# Patient Record
Sex: Female | Born: 1975 | Race: White | Hispanic: No | Marital: Married | Smoking: Never smoker
Health system: Southern US, Community
[De-identification: ages and names within clinical notes are randomized; demographics above are authoritative.]

## PROBLEM LIST (undated history)

## (undated) DIAGNOSIS — J45909 Unspecified asthma, uncomplicated: Secondary | ICD-10-CM

## (undated) DIAGNOSIS — T4145XA Adverse effect of unspecified anesthetic, initial encounter: Secondary | ICD-10-CM

## (undated) DIAGNOSIS — T8859XA Other complications of anesthesia, initial encounter: Secondary | ICD-10-CM

## (undated) HISTORY — PX: DIAGNOSTIC LAPAROSCOPY: SUR761

---

## 2014-11-18 ENCOUNTER — Inpatient Hospital Stay (HOSPITAL_COMMUNITY): Admit: 2014-11-18 | Payer: Self-pay | Admitting: Obstetrics and Gynecology

## 2015-04-22 ENCOUNTER — Other Ambulatory Visit: Payer: Self-pay | Admitting: Obstetrics and Gynecology

## 2015-04-22 ENCOUNTER — Encounter (HOSPITAL_COMMUNITY): Payer: Self-pay | Admitting: *Deleted

## 2015-04-23 ENCOUNTER — Inpatient Hospital Stay (HOSPITAL_COMMUNITY): Payer: Commercial Managed Care - PPO | Admitting: Anesthesiology

## 2015-04-23 ENCOUNTER — Encounter (HOSPITAL_COMMUNITY): Payer: Self-pay | Admitting: *Deleted

## 2015-04-23 ENCOUNTER — Encounter (HOSPITAL_COMMUNITY)
Admission: AD | Disposition: A | Discharge status: Home / Self Care | Payer: Self-pay | Source: Ambulatory Visit | Attending: Obstetrics and Gynecology

## 2015-04-23 ENCOUNTER — Inpatient Hospital Stay (HOSPITAL_COMMUNITY)
Admission: AD | Admit: 2015-04-23 | Discharge: 2015-04-26 | DRG: 765 | Disposition: A | Discharge status: Home / Self Care | Payer: Commercial Managed Care - PPO | Source: Ambulatory Visit | Attending: Obstetrics and Gynecology | Admitting: Obstetrics and Gynecology

## 2015-04-23 DIAGNOSIS — Z3A37 37 weeks gestation of pregnancy: Secondary | ICD-10-CM

## 2015-04-23 DIAGNOSIS — O34211 Maternal care for low transverse scar from previous cesarean delivery: Secondary | ICD-10-CM | POA: Diagnosis present

## 2015-04-23 DIAGNOSIS — O9962 Diseases of the digestive system complicating childbirth: Secondary | ICD-10-CM | POA: Diagnosis present

## 2015-04-23 DIAGNOSIS — O99214 Obesity complicating childbirth: Secondary | ICD-10-CM | POA: Diagnosis present

## 2015-04-23 DIAGNOSIS — K219 Gastro-esophageal reflux disease without esophagitis: Secondary | ICD-10-CM | POA: Diagnosis present

## 2015-04-23 DIAGNOSIS — O328XX0 Maternal care for other malpresentation of fetus, not applicable or unspecified: Secondary | ICD-10-CM | POA: Diagnosis present

## 2015-04-23 DIAGNOSIS — J45909 Unspecified asthma, uncomplicated: Secondary | ICD-10-CM | POA: Diagnosis present

## 2015-04-23 DIAGNOSIS — E669 Obesity, unspecified: Secondary | ICD-10-CM | POA: Diagnosis present

## 2015-04-23 DIAGNOSIS — K831 Obstruction of bile duct: Secondary | ICD-10-CM | POA: Diagnosis present

## 2015-04-23 DIAGNOSIS — Z302 Encounter for sterilization: Secondary | ICD-10-CM | POA: Diagnosis not present

## 2015-04-23 DIAGNOSIS — O9952 Diseases of the respiratory system complicating childbirth: Secondary | ICD-10-CM | POA: Diagnosis present

## 2015-04-23 DIAGNOSIS — O9902 Anemia complicating childbirth: Secondary | ICD-10-CM | POA: Diagnosis present

## 2015-04-23 DIAGNOSIS — Z6836 Body mass index (BMI) 36.0-36.9, adult: Secondary | ICD-10-CM

## 2015-04-23 DIAGNOSIS — O2662 Liver and biliary tract disorders in childbirth: Principal | ICD-10-CM | POA: Diagnosis present

## 2015-04-23 HISTORY — DX: Adverse effect of unspecified anesthetic, initial encounter: T41.45XA

## 2015-04-23 HISTORY — DX: Unspecified asthma, uncomplicated: J45.909

## 2015-04-23 HISTORY — DX: Other complications of anesthesia, initial encounter: T88.59XA

## 2015-04-23 LAB — CBC
HCT: 32.9 % — ABNORMAL LOW (ref 36.0–46.0)
Hemoglobin: 11.3 g/dL — ABNORMAL LOW (ref 12.0–15.0)
MCH: 31.1 pg (ref 26.0–34.0)
MCHC: 34.3 g/dL (ref 30.0–36.0)
MCV: 90.6 fL (ref 78.0–100.0)
PLATELETS: 190 10*3/uL (ref 150–400)
RBC: 3.63 MIL/uL — AB (ref 3.87–5.11)
RDW: 13 % (ref 11.5–15.5)
WBC: 6.8 10*3/uL (ref 4.0–10.5)

## 2015-04-23 LAB — TYPE AND SCREEN
ABO/RH(D): B POS
Antibody Screen: NEGATIVE

## 2015-04-23 LAB — ABO/RH: ABO/RH(D): B POS

## 2015-04-23 SURGERY — Surgical Case
Anesthesia: Spinal | Laterality: Bilateral

## 2015-04-23 MED ORDER — OXYTOCIN 40 UNITS IN LACTATED RINGERS INFUSION - SIMPLE MED: 62.5000 mL/h | INTRAVENOUS | Status: AC

## 2015-04-23 MED ORDER — ONDANSETRON HCL 4 MG/2ML IJ SOLN
INTRAMUSCULAR | Status: DC | PRN
  Administered 2015-04-23: 4 mg via INTRAVENOUS

## 2015-04-23 MED ORDER — MEPERIDINE HCL 25 MG/ML IJ SOLN: 6.2500 mg | INTRAMUSCULAR | Status: DC | PRN

## 2015-04-23 MED ORDER — NALBUPHINE HCL 10 MG/ML IJ SOLN
5.0000 mg | Freq: Once | INTRAMUSCULAR | Status: AC | PRN
  Administered 2015-04-23: 5 mg via SUBCUTANEOUS

## 2015-04-23 MED ORDER — ACETAMINOPHEN 325 MG PO TABS
650.0000 mg | ORAL_TABLET | ORAL | Status: DC | PRN
  Administered 2015-04-23: 650 mg via ORAL
  Filled 2015-04-23: qty 2

## 2015-04-23 MED ORDER — FENTANYL CITRATE (PF) 100 MCG/2ML IJ SOLN
INTRAMUSCULAR | Status: DC | PRN
Start: 2015-04-23 — End: 2015-04-23
  Administered 2015-04-23: 20 ug via INTRATHECAL

## 2015-04-23 MED ORDER — SCOPOLAMINE 1 MG/3DAYS TD PT72
1.0000 | MEDICATED_PATCH | Freq: Once | TRANSDERMAL | Status: DC
  Administered 2015-04-23: 1.5 mg via TRANSDERMAL

## 2015-04-23 MED ORDER — PHENYLEPHRINE 8 MG IN D5W 100 ML (0.08MG/ML) PREMIX OPTIME
INJECTION | INTRAVENOUS | Status: DC | PRN
  Administered 2015-04-23: 60 ug/min via INTRAVENOUS

## 2015-04-23 MED ORDER — CEFAZOLIN SODIUM-DEXTROSE 2-3 GM-% IV SOLR
2.0000 g | INTRAVENOUS | Status: AC
  Administered 2015-04-23: 2 g via INTRAVENOUS

## 2015-04-23 MED ORDER — MENTHOL 3 MG MT LOZG: 1.0000 | LOZENGE | OROMUCOSAL | Status: DC | PRN

## 2015-04-23 MED ORDER — SODIUM CHLORIDE 0.9 % IJ SOLN: 3.0000 mL | Freq: Two times a day (BID) | INTRAMUSCULAR | Status: DC

## 2015-04-23 MED ORDER — IBUPROFEN 600 MG PO TABS: 600.0000 mg | ORAL_TABLET | Freq: Four times a day (QID) | ORAL | Status: DC | PRN

## 2015-04-23 MED ORDER — FENTANYL CITRATE (PF) 100 MCG/2ML IJ SOLN: 25.0000 ug | INTRAMUSCULAR | Status: DC | PRN

## 2015-04-23 MED ORDER — METHYLERGONOVINE MALEATE 0.2 MG/ML IJ SOLN: 0.2000 mg | INTRAMUSCULAR | Status: DC | PRN

## 2015-04-23 MED ORDER — BUPIVACAINE IN DEXTROSE 0.75-8.25 % IT SOLN
INTRATHECAL | Status: DC | PRN
  Administered 2015-04-23: 1.6 mL via INTRATHECAL

## 2015-04-23 MED ORDER — 0.9 % SODIUM CHLORIDE (POUR BTL) OPTIME
TOPICAL | Status: DC | PRN
  Administered 2015-04-23: 1000 mL

## 2015-04-23 MED ORDER — LACTATED RINGERS IV SOLN
INTRAVENOUS | Status: DC
  Administered 2015-04-23: 21:00:00 via INTRAVENOUS

## 2015-04-23 MED ORDER — MORPHINE SULFATE (PF) 0.5 MG/ML IJ SOLN
INTRAMUSCULAR | Status: AC
  Filled 2015-04-23: qty 100

## 2015-04-23 MED ORDER — SENNOSIDES-DOCUSATE SODIUM 8.6-50 MG PO TABS
2.0000 | ORAL_TABLET | ORAL | Status: DC
  Administered 2015-04-23: 2 via ORAL
  Filled 2015-04-23 (×2): qty 2

## 2015-04-23 MED ORDER — SODIUM CHLORIDE 0.9 % IV SOLN: 250.0000 mL | INTRAVENOUS | Status: DC

## 2015-04-23 MED ORDER — LACTATED RINGERS IV SOLN
INTRAVENOUS | Status: DC | PRN
  Administered 2015-04-23: 12:00:00 via INTRAVENOUS

## 2015-04-23 MED ORDER — SCOPOLAMINE 1 MG/3DAYS TD PT72
MEDICATED_PATCH | TRANSDERMAL | Status: AC
  Administered 2015-04-23: 1.5 mg via TRANSDERMAL
  Filled 2015-04-23: qty 1

## 2015-04-23 MED ORDER — SIMETHICONE 80 MG PO CHEW
80.0000 mg | CHEWABLE_TABLET | ORAL | Status: DC
  Administered 2015-04-24 – 2015-04-25 (×2): 80 mg via ORAL
  Filled 2015-04-23 (×3): qty 1

## 2015-04-23 MED ORDER — NALBUPHINE HCL 10 MG/ML IJ SOLN: 5.0000 mg | INTRAMUSCULAR | Status: DC | PRN

## 2015-04-23 MED ORDER — PHENYLEPHRINE HCL 10 MG/ML IJ SOLN
INTRAMUSCULAR | Status: DC | PRN
  Administered 2015-04-23: 80 ug via INTRAVENOUS

## 2015-04-23 MED ORDER — BUPIVACAINE HCL (PF) 0.25 % IJ SOLN
INTRAMUSCULAR | Status: AC
  Filled 2015-04-23: qty 30

## 2015-04-23 MED ORDER — MORPHINE SULFATE (PF) 0.5 MG/ML IJ SOLN
INTRAMUSCULAR | Status: DC | PRN
  Administered 2015-04-23: .1 mg via INTRATHECAL

## 2015-04-23 MED ORDER — WITCH HAZEL-GLYCERIN EX PADS: 1.0000 "application " | MEDICATED_PAD | CUTANEOUS | Status: DC | PRN

## 2015-04-23 MED ORDER — DIPHENHYDRAMINE HCL 25 MG PO CAPS: 25.0000 mg | ORAL_CAPSULE | Freq: Four times a day (QID) | ORAL | Status: DC | PRN

## 2015-04-23 MED ORDER — IBUPROFEN 600 MG PO TABS
600.0000 mg | ORAL_TABLET | Freq: Four times a day (QID) | ORAL | Status: DC
  Administered 2015-04-23 – 2015-04-26 (×11): 600 mg via ORAL
  Filled 2015-04-23 (×12): qty 1

## 2015-04-23 MED ORDER — OXYTOCIN 10 UNIT/ML IJ SOLN
40.0000 [IU] | INTRAVENOUS | Status: DC | PRN
  Administered 2015-04-23: 40 [IU] via INTRAVENOUS

## 2015-04-23 MED ORDER — LACTATED RINGERS IV SOLN
INTRAVENOUS | Status: DC
  Administered 2015-04-23 (×3): via INTRAVENOUS

## 2015-04-23 MED ORDER — LANOLIN HYDROUS EX OINT: 1.0000 "application " | TOPICAL_OINTMENT | CUTANEOUS | Status: DC | PRN

## 2015-04-23 MED ORDER — NALOXONE HCL 2 MG/2ML IJ SOSY
1.0000 ug/kg/h | PREFILLED_SYRINGE | INTRAVENOUS | Status: DC | PRN
  Filled 2015-04-23: qty 2

## 2015-04-23 MED ORDER — OXYCODONE-ACETAMINOPHEN 5-325 MG PO TABS
1.0000 | ORAL_TABLET | ORAL | Status: DC | PRN
  Administered 2015-04-24 (×2): 1 via ORAL
  Filled 2015-04-23 (×5): qty 1

## 2015-04-23 MED ORDER — DIPHENHYDRAMINE HCL 50 MG/ML IJ SOLN: 12.5000 mg | INTRAMUSCULAR | Status: DC | PRN

## 2015-04-23 MED ORDER — SODIUM CHLORIDE 0.9 % IJ SOLN: 3.0000 mL | INTRAMUSCULAR | Status: DC | PRN

## 2015-04-23 MED ORDER — KETOROLAC TROMETHAMINE 30 MG/ML IJ SOLN: 30.0000 mg | Freq: Four times a day (QID) | INTRAMUSCULAR | Status: AC | PRN

## 2015-04-23 MED ORDER — NALBUPHINE HCL 10 MG/ML IJ SOLN
5.0000 mg | INTRAMUSCULAR | Status: DC | PRN
  Administered 2015-04-23: 5 mg via SUBCUTANEOUS

## 2015-04-23 MED ORDER — ONDANSETRON HCL 4 MG/2ML IJ SOLN
INTRAMUSCULAR | Status: AC
  Filled 2015-04-23: qty 2

## 2015-04-23 MED ORDER — BUPIVACAINE HCL (PF) 0.25 % IJ SOLN
INTRAMUSCULAR | Status: DC | PRN
  Administered 2015-04-23: 10 mL

## 2015-04-23 MED ORDER — NALOXONE HCL 0.4 MG/ML IJ SOLN: 0.4000 mg | INTRAMUSCULAR | Status: DC | PRN

## 2015-04-23 MED ORDER — KETOROLAC TROMETHAMINE 30 MG/ML IJ SOLN
INTRAMUSCULAR | Status: AC
  Filled 2015-04-23: qty 1

## 2015-04-23 MED ORDER — FENTANYL CITRATE (PF) 100 MCG/2ML IJ SOLN
INTRAMUSCULAR | Status: AC
  Filled 2015-04-23: qty 4

## 2015-04-23 MED ORDER — BUPIVACAINE IN DEXTROSE 0.75-8.25 % IT SOLN
INTRATHECAL | Status: AC
  Filled 2015-04-23: qty 2

## 2015-04-23 MED ORDER — SIMETHICONE 80 MG PO CHEW: 80.0000 mg | CHEWABLE_TABLET | ORAL | Status: DC | PRN

## 2015-04-23 MED ORDER — KETOROLAC TROMETHAMINE 30 MG/ML IJ SOLN
30.0000 mg | Freq: Four times a day (QID) | INTRAMUSCULAR | Status: AC | PRN
  Administered 2015-04-23: 30 mg via INTRAMUSCULAR

## 2015-04-23 MED ORDER — PRENATAL MULTIVITAMIN CH
1.0000 | ORAL_TABLET | Freq: Every day | ORAL | Status: DC
  Administered 2015-04-24 – 2015-04-26 (×3): 1 via ORAL
  Filled 2015-04-23 (×3): qty 1

## 2015-04-23 MED ORDER — BISACODYL 10 MG RE SUPP: 10.0000 mg | Freq: Every day | RECTAL | Status: DC | PRN

## 2015-04-23 MED ORDER — CEFAZOLIN SODIUM-DEXTROSE 2-3 GM-% IV SOLR
INTRAVENOUS | Status: AC
  Filled 2015-04-23: qty 50

## 2015-04-23 MED ORDER — METHYLERGONOVINE MALEATE 0.2 MG PO TABS: 0.2000 mg | ORAL_TABLET | ORAL | Status: DC | PRN

## 2015-04-23 MED ORDER — OXYCODONE-ACETAMINOPHEN 5-325 MG PO TABS
2.0000 | ORAL_TABLET | ORAL | Status: DC | PRN
  Administered 2015-04-24 – 2015-04-26 (×9): 2 via ORAL
  Filled 2015-04-23 (×9): qty 2

## 2015-04-23 MED ORDER — NALBUPHINE HCL 10 MG/ML IJ SOLN
INTRAMUSCULAR | Status: AC
  Filled 2015-04-23: qty 1

## 2015-04-23 MED ORDER — MEASLES, MUMPS & RUBELLA VAC ~~LOC~~ INJ: 0.5000 mL | INJECTION | Freq: Once | SUBCUTANEOUS | Status: DC

## 2015-04-23 MED ORDER — SIMETHICONE 80 MG PO CHEW
80.0000 mg | CHEWABLE_TABLET | Freq: Three times a day (TID) | ORAL | Status: DC
  Administered 2015-04-23 – 2015-04-26 (×8): 80 mg via ORAL
  Filled 2015-04-23 (×8): qty 1

## 2015-04-23 MED ORDER — OXYTOCIN 10 UNIT/ML IJ SOLN
INTRAMUSCULAR | Status: AC
  Filled 2015-04-23: qty 4

## 2015-04-23 MED ORDER — DEXAMETHASONE SODIUM PHOSPHATE 4 MG/ML IJ SOLN
INTRAMUSCULAR | Status: AC
  Filled 2015-04-23: qty 2

## 2015-04-23 MED ORDER — DEXAMETHASONE SODIUM PHOSPHATE 4 MG/ML IJ SOLN: INTRAMUSCULAR | Status: DC | PRN

## 2015-04-23 MED ORDER — ZOLPIDEM TARTRATE 5 MG PO TABS: 5.0000 mg | ORAL_TABLET | Freq: Every evening | ORAL | Status: DC | PRN

## 2015-04-23 MED ORDER — FLEET ENEMA 7-19 GM/118ML RE ENEM: 1.0000 | ENEMA | Freq: Every day | RECTAL | Status: DC | PRN

## 2015-04-23 MED ORDER — ONDANSETRON HCL 4 MG/2ML IJ SOLN: 4.0000 mg | Freq: Three times a day (TID) | INTRAMUSCULAR | Status: DC | PRN

## 2015-04-23 MED ORDER — PHENYLEPHRINE 8 MG IN D5W 100 ML (0.08MG/ML) PREMIX OPTIME
INJECTION | INTRAVENOUS | Status: AC
  Filled 2015-04-23: qty 100

## 2015-04-23 MED ORDER — DIBUCAINE 1 % RE OINT
1.0000 | TOPICAL_OINTMENT | RECTAL | Status: DC | PRN
Start: 2015-04-23 — End: 2015-04-26

## 2015-04-23 MED ORDER — DIPHENHYDRAMINE HCL 25 MG PO CAPS: 25.0000 mg | ORAL_CAPSULE | ORAL | Status: DC | PRN

## 2015-04-23 MED ORDER — NALBUPHINE HCL 10 MG/ML IJ SOLN: 5.0000 mg | Freq: Once | INTRAMUSCULAR | Status: AC | PRN

## 2015-04-23 MED ORDER — FERROUS SULFATE 325 (65 FE) MG PO TABS
325.0000 mg | ORAL_TABLET | Freq: Two times a day (BID) | ORAL | Status: DC
  Administered 2015-04-23 – 2015-04-26 (×6): 325 mg via ORAL
  Filled 2015-04-23 (×6): qty 1

## 2015-04-23 SURGICAL SUPPLY — 41 items
BARRIER ADHS 3X4 INTERCEED (GAUZE/BANDAGES/DRESSINGS) ×2 IMPLANT
BENZOIN TINCTURE PRP APPL 2/3 (GAUZE/BANDAGES/DRESSINGS) IMPLANT
CLAMP CORD UMBIL (MISCELLANEOUS) IMPLANT
CLOTH BEACON ORANGE TIMEOUT ST (SAFETY) ×2 IMPLANT
CONTAINER PREFILL 10% NBF 15ML (MISCELLANEOUS) IMPLANT
DRAPE C SECTION CLR SCREEN (DRAPES) ×2 IMPLANT
DRAPE SHEET LG 3/4 BI-LAMINATE (DRAPES) IMPLANT
DRSG OPSITE POSTOP 4X10 (GAUZE/BANDAGES/DRESSINGS) ×2 IMPLANT
DURAPREP 26ML APPLICATOR (WOUND CARE) ×2 IMPLANT
ELECT REM PT RETURN 9FT ADLT (ELECTROSURGICAL) ×2
ELECTRODE REM PT RTRN 9FT ADLT (ELECTROSURGICAL) ×1 IMPLANT
EXTRACTOR VACUUM M CUP 4 TUBE (SUCTIONS) IMPLANT
GLOVE BIOGEL PI IND STRL 7.0 (GLOVE) ×1 IMPLANT
GLOVE BIOGEL PI INDICATOR 7.0 (GLOVE) ×1
GLOVE ECLIPSE 6.5 STRL STRAW (GLOVE) ×2 IMPLANT
GOWN STRL REUS W/TWL LRG LVL3 (GOWN DISPOSABLE) ×4 IMPLANT
KIT ABG SYR 3ML LUER SLIP (SYRINGE) IMPLANT
NEEDLE HYPO 22GX1.5 SAFETY (NEEDLE) ×2 IMPLANT
NEEDLE HYPO 25X5/8 SAFETYGLIDE (NEEDLE) IMPLANT
NS IRRIG 1000ML POUR BTL (IV SOLUTION) ×2 IMPLANT
PACK C SECTION WH (CUSTOM PROCEDURE TRAY) ×2 IMPLANT
PAD OB MATERNITY 4.3X12.25 (PERSONAL CARE ITEMS) ×2 IMPLANT
RETRACTOR WND ALEXIS 25 LRG (MISCELLANEOUS) ×1 IMPLANT
RTRCTR C-SECT PINK 25CM LRG (MISCELLANEOUS) IMPLANT
RTRCTR WOUND ALEXIS 25CM LRG (MISCELLANEOUS) ×2
STAPLER VISISTAT 35W (STAPLE) ×2 IMPLANT
STRIP CLOSURE SKIN 1/2X4 (GAUZE/BANDAGES/DRESSINGS) IMPLANT
SUT CHROMIC GUT AB #0 18 (SUTURE) IMPLANT
SUT MNCRL 0 VIOLET CTX 36 (SUTURE) ×3 IMPLANT
SUT MON AB 4-0 PS1 27 (SUTURE) IMPLANT
SUT MONOCRYL 0 CTX 36 (SUTURE) ×3
SUT PLAIN 2 0 (SUTURE)
SUT PLAIN 2 0 XLH (SUTURE) ×2 IMPLANT
SUT PLAIN ABS 2-0 CT1 27XMFL (SUTURE) IMPLANT
SUT VIC AB 0 CT1 36 (SUTURE) ×4 IMPLANT
SUT VIC AB 2-0 CT1 27 (SUTURE) ×1
SUT VIC AB 2-0 CT1 TAPERPNT 27 (SUTURE) ×1 IMPLANT
SUT VIC AB 4-0 PS2 27 (SUTURE) IMPLANT
SYR CONTROL 10ML LL (SYRINGE) ×2 IMPLANT
TOWEL OR 17X24 6PK STRL BLUE (TOWEL DISPOSABLE) ×2 IMPLANT
TRAY FOLEY CATH SILVER 14FR (SET/KITS/TRAYS/PACK) IMPLANT

## 2015-04-23 NOTE — H&P (Signed)
Mindy Stewart is a 39 y.o. female presenting for rpt C/S, BTL @ 37 weeks due to ICP. Pt desires permanent sterilization. Prev C/S x 2  Maternal Medical History:  Fetal activity: Perceived fetal activity is normal.    Prenatal complications: ICP    OB History    No data available     Past Medical History  Diagnosis Date  . Complication of anesthesia     problems with epidural- 3 attempts  . Asthma     rare inhaler use   Past Surgical History  Procedure Laterality Date  . Cesarean section      x 2  . Diagnostic laparoscopy      2002   Family History: family history is not on file. Social History:  reports that she has never smoked. She does not have any smokeless tobacco history on file. Her alcohol and drug histories are not on file.   Prenatal Transfer Tool  Maternal Diabetes: No Genetic Screening: Normal Maternal Ultrasounds/Referrals: Normal Fetal Ultrasounds or other Referrals:  None Maternal Substance Abuse:  No Significant Maternal Medications:  Meds include: Other: actigall Significant Maternal Lab Results:  Lab values include: Group B Strep negative Other Comments:  ICP  Review of Systems  All other systems reviewed and are negative.     There were no vitals taken for this visit. Maternal Exam:  Abdomen: Patient reports no abdominal tenderness.   Physical Exam  Constitutional: She is oriented to person, place, and time. She appears well-developed and well-nourished.  HENT:  Head: Atraumatic.  Eyes: EOM are normal.  Neck: Neck supple.  Cardiovascular: Regular rhythm.   Respiratory: Effort normal.  Neurological: She is alert and oriented to person, place, and time.  Skin: Skin is warm and dry.  Psychiatric: She has a normal mood and affect.    Prenatal labs: ABO, Rh:  B positive Antibody:  neg Rubella:  indeterminate RPR:   NR HBsAg:   neg HIV:   NR GBS:   negative( 10/21)  Assessment/Plan: ICP IUP @ 37 weeks Previous C/S x  2 Desires sterilization P) admit rpt C/s, TL. Routine labs. Rubella pp vaccine   Jammal Sarr A 04/23/2015, 7:39 AM

## 2015-04-23 NOTE — Anesthesia Postprocedure Evaluation (Signed)
  Anesthesia Post-op Note  Patient: Mindy Stewart  Procedure(s) Performed: Procedure(s) with comments: CESAREAN SECTION WITH BILATERAL TUBAL LIGATION (Bilateral) - EDDL: 05/13/15 Allergy: Coconut, Tree Nuts, Prednisone  Patient Location: Mother/Baby  Anesthesia Type:Spinal and Epidural  Level of Consciousness: awake, alert , oriented and patient cooperative  Airway and Oxygen Therapy: Patient Spontanous Breathing  Post-op Pain: none  Post-op Assessment: Post-op Vital signs reviewed, Patient's Cardiovascular Status Stable, Respiratory Function Stable, Patent Airway, No headache, No backache and Patient able to bend at knees              Post-op Vital Signs: Reviewed and stable  Last Vitals:  Filed Vitals:   04/23/15 1710  BP: 97/74  Pulse: 73  Temp:   Resp:     Complications: No apparent anesthesia complications

## 2015-04-23 NOTE — Brief Op Note (Signed)
04/23/2015  12:09 PM  PATIENT:  Mindy Stewart  39 y.o. female  PRE-OPERATIVE DIAGNOSIS:  Previous Cesarean Section x 2, Cholestasis of Pregnancy , desires sterilization, IUP @ 37 1/7 weeks  POST-OPERATIVE DIAGNOSIS:  Previous Cesarean Section x 2, Cholestasis of Pregnancy, desires sterilization, IUP @ 37 1/7 weeks  PROCEDURE: Repeat Cesarean section, kerr hysterotomy Modified pomeroy bilateral tubal ligation   SURGEON:  Surgeon(s) and Role:    * Maxie BetterSheronette Jullien Granquist, MD - Primary  PHYSICIAN ASSISTANT:   ASSISTANTS: Raelyn Moraolitta Dawson, CNM   ANESTHESIA:   spinal Findings: breech( complete) live female, nl tubes and ovaries . Omentum to anterior abd wall, clear AF. EBL:  Total I/O In: 3200 [I.V.:3200] Out: 1100 [Urine:300; Blood:800]  BLOOD ADMINISTERED:none  DRAINS: none   LOCAL MEDICATIONS USED:  Marcaine  SPECIMEN:  Source of Specimen:  portion of right and left tube  DISPOSITION OF SPECIMEN:  PATHOLOGY  COUNTS:  YES  TOURNIQUET:  * No tourniquets in log *  DICTATION: .Other Dictation: Dictation Number [id number]  PLAN OF CARE: Admit to inpatient   PATIENT DISPOSITION:  PACU - hemodynamically stable.   Delay start of Pharmacological VTE agent (>24hrs) due to surgical blood loss or risk of bleeding: no

## 2015-04-23 NOTE — Lactation Note (Signed)
This note was copied from the chart of Boy Darrell Jewelamara Sabo-Thayer. Lactation Consultation Note  Patient Name: Boy Darrell Jewelamara Sabo-Thayer ZOXWR'UToday's Date: 04/23/2015 Reason for consult: Initial assessment Experienced bf mom, baby at 8 hr of life, and has had 1 good bf. Mom reports that she has had a good supply with her others and no latch issue but would like someone to observe a feeding. Went over LPT infant information. Mom does not really like to pump, she requested to do manual expression for today's supplementing. She asked for the oral syring, spoon, and medicine cup as alternative feeders. She will try to wake baby every 2-3 hr around the clock to feed. She will offer her expressed milk in the volumes provided on the chart after each bf. She is aware that she can use a DEBP if she changes her mind. Given lactation handouts, aware of O/P lactation and support group.    Maternal Data Has patient been taught Hand Expression?: Yes Does the patient have breastfeeding experience prior to this delivery?: Yes  Feeding Feeding Type: Breast Fed Length of feed: 40 min (on and off)  LATCH Score/Interventions Latch:  [facility]                    Lactation Tools Discussed/Used WIC Program: No   Consult Status Consult Status: Follow-up Date: 04/24/15 Follow-up type: In-patient    Rulon Eisenmengerlizabeth E Jeanett Antonopoulos 04/23/2015, 8:01 PM

## 2015-04-23 NOTE — Anesthesia Preprocedure Evaluation (Addendum)
Anesthesia Evaluation  Patient identified by MRN, date of birth, ID band Patient awake    Reviewed: Allergy & Precautions, NPO status , Patient's Chart, lab work & pertinent test results  History of Anesthesia Complications (+) history of anesthetic complications  Airway Mallampati: III  TM Distance: >3 FB Neck ROM: Full    Dental no notable dental hx. (+) Teeth Intact   Pulmonary asthma ,    Pulmonary exam normal breath sounds clear to auscultation       Cardiovascular negative cardio ROS Normal cardiovascular exam Rhythm:Regular Rate:Normal     Neuro/Psych negative neurological ROS  negative psych ROS   GI/Hepatic Neg liver ROS, GERD  Medicated and Controlled,  Endo/Other  Obesity  Renal/GU negative Renal ROS  negative genitourinary   Musculoskeletal negative musculoskeletal ROS (+)   Abdominal (+) + obese,   Peds  Hematology  (+) anemia ,   Anesthesia Other Findings   Reproductive/Obstetrics Previous C/section x 2 Cholestasis of pregnancy Desires sterilization                             Anesthesia Physical Anesthesia Plan  ASA: II  Anesthesia Plan: Combined Spinal and Epidural   Post-op Pain Management:    Induction:   Airway Management Planned: Natural Airway  Additional Equipment:   Intra-op Plan:   Post-operative Plan:   Informed Consent: I have reviewed the patients History and Physical, chart, labs and discussed the procedure including the risks, benefits and alternatives for the proposed anesthesia with the patient or authorized representative who has indicated his/her understanding and acceptance.   Dental advisory given  Plan Discussed with: CRNA, Anesthesiologist and Surgeon  Anesthesia Plan Comments:        Anesthesia Quick Evaluation

## 2015-04-23 NOTE — Transfer of Care (Signed)
Immediate Anesthesia Transfer of Care Note  Patient: Mindy Stewart  Procedure(s) Performed: Procedure(s) with comments: CESAREAN SECTION WITH BILATERAL TUBAL LIGATION (Bilateral) - EDDL: 05/13/15 Allergy: Coconut, Tree Nuts, Prednisone  Patient Location: PACU  Anesthesia Type:Spinal and Epidural  Level of Consciousness: awake, alert , oriented and patient cooperative  Airway & Oxygen Therapy: Patient Spontanous Breathing  Post-op Assessment: Report given to RN and Post -op Vital signs reviewed and stable  Post vital signs: Reviewed and stable  Last Vitals:  Filed Vitals:   04/23/15 0924  BP: 123/89  Pulse: 75  Temp: 36.7 C  Resp: 20    Complications: No apparent anesthesia complications

## 2015-04-23 NOTE — Anesthesia Procedure Notes (Signed)
Spinal Patient location during procedure: OR Start time: 04/23/2015 10:43 AM Staffing Anesthesiologist: Mal AmabileFOSTER, Analise Glotfelty Performed by: anesthesiologist  Preanesthetic Checklist Completed: patient identified, site marked, surgical consent, pre-op evaluation, timeout performed, IV checked, risks and benefits discussed and monitors and equipment checked Spinal Block Patient position: sitting Prep: DuraPrep Patient monitoring: heart rate, cardiac monitor, continuous pulse ox and blood pressure Approach: midline Location: L4-5 Injection technique: single-shot Needle Needle type: Tuohy and Spinocan  Needle gauge: 25 G Needle length: 9 cm Needle insertion depth: 6 cm Catheter at skin depth: 11 cm Assessment Sensory level: T4 Events: paresthesia Additional Notes Epidural performed using LOR with air. No CSF, heme or paresthesias. SAB through epidural needle. Transient paresthesia right leg. CSF clear with free flow. LA+ Narcotics injected and spinal needle withdrawn. Epidural catheter threaded 5 cm into the epidural space. Epidural needle withdrawn and sterile dressing applied. Patient turned to supine with LUD. She tolerated the procedure well and an adequate sensroy level was obtained.

## 2015-04-24 LAB — CBC
HCT: 29 % — ABNORMAL LOW (ref 36.0–46.0)
Hemoglobin: 10 g/dL — ABNORMAL LOW (ref 12.0–15.0)
MCH: 31.5 pg (ref 26.0–34.0)
MCHC: 34.5 g/dL (ref 30.0–36.0)
MCV: 91.5 fL (ref 78.0–100.0)
PLATELETS: 173 10*3/uL (ref 150–400)
RBC: 3.17 MIL/uL — AB (ref 3.87–5.11)
RDW: 13.2 % (ref 11.5–15.5)
WBC: 10.3 10*3/uL (ref 4.0–10.5)

## 2015-04-24 LAB — BIRTH TISSUE RECOVERY COLLECTION (PLACENTA DONATION)

## 2015-04-24 LAB — RPR: RPR: NONREACTIVE

## 2015-04-24 MED ORDER — INFLUENZA VAC SPLIT QUAD 0.5 ML IM SUSY: 0.5000 mL | PREFILLED_SYRINGE | INTRAMUSCULAR | Status: DC

## 2015-04-24 MED ORDER — TETANUS-DIPHTH-ACELL PERTUSSIS 5-2.5-18.5 LF-MCG/0.5 IM SUSP: 0.5000 mL | Freq: Once | INTRAMUSCULAR | Status: DC

## 2015-04-24 MED ORDER — LORATADINE 10 MG PO TABS
10.0000 mg | ORAL_TABLET | Freq: Every day | ORAL | Status: DC
  Administered 2015-04-24 – 2015-04-25 (×2): 10 mg via ORAL
  Filled 2015-04-24 (×2): qty 1

## 2015-04-24 MED ORDER — URSODIOL 300 MG PO CAPS
300.0000 mg | ORAL_CAPSULE | Freq: Three times a day (TID) | ORAL | Status: DC
  Administered 2015-04-24 – 2015-04-26 (×6): 300 mg via ORAL
  Filled 2015-04-24 (×10): qty 1

## 2015-04-24 NOTE — Lactation Note (Signed)
This note was copied from the chart of Mindy Stewart. Lactation Consultation Note  Patient Name: Mindy Stewart UJWJX'BToday's Date: 04/24/2015 Reason for consult: Follow-up assessment Mom was sleeping, FOB requested that LC come back later.   Maternal Data    Feeding Feeding Type: Breast Fed Length of feed: 2 min  LATCH Score/Interventions Latch: Repeated attempts needed to sustain latch, nipple held in mouth throughout feeding, stimulation needed to elicit sucking reflex. Intervention(s): Skin to skin Intervention(s): Assist with latch  Audible Swallowing: A few with stimulation  Type of Nipple: Everted at rest and after stimulation  Comfort (Breast/Nipple): Soft / non-tender     Hold (Positioning): Assistance needed to correctly position infant at breast and maintain latch.  LATCH Score: 7  Lactation Tools Discussed/Used     Consult Status Consult Status: Follow-up Date: 04/25/15 Follow-up type: In-patient    Rulon Eisenmengerlizabeth E Lyfe Monger 04/24/2015, 7:38 PM

## 2015-04-24 NOTE — Progress Notes (Addendum)
POD # 1  Subjective: Pt reports feeling well, intermittent itching-mostly on palms and feet/ Pain controlled with Motrin and Percocet Tolerating po/ Foley d/c'd and voiding without problems/ No n/v/ Flatus present Activity: ad lib Bleeding is light Newborn info:  Information for the patient's newborn:  [place], Sammons [[id number]]  female   Circumcision: planning outpt/ Feeding: breast  Objective:  VS:  Filed Vitals:   04/23/15 2110 04/24/15 0123 04/24/15 0530 04/24/15 0915  BP: 102/54 91/59 97/53  90/47  Pulse: 70 61 62 68  Temp: 98.1 F (36.7 C) 98.2 F (36.8 C) 97.9 F (36.6 C)   TempSrc: Oral Oral Oral   Resp: 16 16 16 16   Height:      Weight:      SpO2:  97% 98% 98%     I&O: Intake/Output      11/04 0701 - 11/05 0700 11/05 0701 - 11/06 0700   P.O. 1000    I.V. (mL/kg) 3575 (34.6)    Total Intake(mL/kg) 4575 (44.2)    Urine (mL/kg/hr) 2500 600 (1.1)   Blood 800    Total Output 3300 600   Net +1275 -600           Recent Labs  04/23/15 0915 04/24/15 0605  WBC 6.8 10.3  HGB 11.3* 10.0*  HCT 32.9* 29.0*  PLT 190 173    Blood type: --/--/B POS, B POS (11/04 0915) Rubella:      Physical Exam:  General: alert, cooperative and no distress CV: Regular rate and rhythm Resp: CTA bilaterally Abdomen: soft, nontender, normal bowel sounds Incision: Covered with Tegaderm and honeycomb dressing; no significant drainage, edema, bruising, or erythema; well approximated with staples Uterine Fundus: firm, below umbilicus, nontender Lochia: minimal Ext: extremities normal, atraumatic, no cyanosis or edema and Homans sign is negative, no sign of DVT   Assessment: POD # 1/ G4P1000/ S/P C/Section & BTL d/t repeat, ICP ICP, delivered Rubella non-immune Doing well  Plan: Restart Actigall MMR before discharge Ambulate Continue routine post op orders Consider early discharge tomorrow   Signed: Graciela Husbands, MSN, CNM 04/24/2015, 12:21 PM

## 2015-04-24 NOTE — Op Note (Signed)
[name]:  SABO-THAYER, [name]          ACCOUNT NO.:  [id number]  MEDICAL RECORD NO.:  [id number]  LOCATION:  9144                          FACILITY:  [facility]  PHYSICIAN:  Maxie BetterSheronette Hurley Blevins, M.D.DATE OF BIRTH:  [birth year]  DATE OF PROCEDURE:  04/23/2015 DATE OF DISCHARGE:                              OPERATIVE REPORT   PREOPERATIVE DIAGNOSES: 1. Cholestasis of pregnancy. 2. Intrauterine gestation at 37-1/7th weeks. 3. Previous cesarean section x2. 4. Desires sterilization.  PROCEDURES: 1. Repeat cesarean section, Kerr hysterotomy. 2. Modified Pomeroy tubal ligation.  POSTOPERATIVE DIAGNOSES: 1. Cholestasis of pregnancy. 2. Intrauterine gestation at 37-1/7th weeks. 3. Previous cesarean section x2. 4. Desires sterilization.  ANESTHESIA:  Spinal.  SURGEON:  Maxie BetterSheronette Dachelle Molzahn, M.D.  ASSISTANT:  Raelyn Moraolitta Dawson, CNM.  DESCRIPTION OF PROCEDURE:  Under adequate spinal anesthesia, the patient was placed in supine position with a left lateral tilt.  She was sterilely prepped and draped in usual fashion.  An indwelling Foley catheter was sterilely placed.  A 0.25% Marcaine was injected along the previous Pfannenstiel skin incision.  Pfannenstiel skin incision was then made, carried down to the rectus fascia.  The rectus fascia was opened transversely.  The rectus fascia was then bluntly and sharply dissected off the rectus muscle in superior and inferior fashion.  At that point, the parietal peritoneum was incidentally entered and the omentum was noted to be focally adherent to the anterior abdominal wall. Continued sharp dissection was done superiorly and the parietal peritoneum was further extended inferiorly.  The  omental adhesion was then removed.  An Alexis self-retaining retractor was then placed.  The vesicouterine peritoneum was carefully opened, but the bladder was limited in its ability to be dissected low off due to prior scarring.  A higher curvilinear low transverse  uterine incision was then made in the lower uterine segment and extended with bandage scissor, resulting in the copious rupture of membranes.  The baby was in a complete breech position, was subsequently delivered in the usual breech maneuvers and the cord was clamped, cut. The live female infant was bulb suctioned on the abdomen and  was transferred to the awaiting pediatrician who assigned Apgars of 8 and 9 at 1 and 5 minutes.  The placenta which was fundal and posterior was manually removed.  Uterine cavity was cleaned of debris.  Uterine incision had no extension.  Further sharp dissection of the bladder off the lower uterine segment was then performed and the incision was closed in 1 layer using 0 Monocryl running lock stitch.  Good hemostasis was achieved.  Normal tubes and ovaries were noted bilaterally.  The midportion of both fallopian tubes were then identified.  The mesosalpinx of both tubes was opened with cautery and the intervening segment of tube on both sides was then tied with 0 chromic suture proximally and distally x2 and the intervening segment of both tubes removed.  The abdomen had been irrigated and suctioned.  Interceed was placed over uterine segment.  Additional omental adhesion was then lysed and freed the omentum entirely from the anterior abdominal wall.  The Alexis retractor having been removed allowed for the parietal peritoneum to be closed with 2-0 Vicryl.  The rectus fascia was closed with 0 Vicryl  x2.  The subcutaneous area was irrigated, small bleeders cauterized.  Interrupted 2-0 plain sutures placed and the skin approximated with Ethicon staples.  SPECIMEN:  Portion of right and left fallopian tubes sent to Pathology. Placenta was not sent.  ESTIMATED BLOOD LOSS:  800 mL.  INTRAOPERATIVE FLUID:  3 L.  URINE OUTPUT:  300 mL clear yellow urine.  COUNTS:  Sponge and instrument counts x2 were correct.  COMPLICATIONS:  None.  The patient tolerated  the procedure well, was transferred to recovery room in stable condition.  The baby was placed skin-to-skin.     Maxie Better, M.D.     [place]/MEDQ  D:  04/23/2015  T:  04/24/2015  Job:  [id number]

## 2015-04-25 NOTE — Progress Notes (Signed)
POD # 2  Subjective: Pt reports feeling ok-tired-some gas pain/ Pain controlled with Motrin and Percocet Tolerating po/Voiding without problems/ No n/v/ Flatus present Activity: ad lib Bleeding is light Newborn info:  Information for the patient's newborn:  Jamoni, Broadfoot [[id number]]  female   Circumcision: planning outpt/ Feeding: breast  Objective: VS:  Filed Vitals:   04/24/15 0915 04/24/15 1400 04/24/15 1812 04/25/15 0513  BP: 90/47  113/67 142/80  Pulse: 68  81 72  Temp:  98.3 F (36.8 C) 98.2 F (36.8 C) 98.5 F (36.9 C)  TempSrc:  Oral Oral Oral  Resp: _0 Height:      Weight:      SpO2: 98%       I&O: Intake/Output      11/05 0701 - 11/06 0700 11/06 0701 - 11/07 0700   P.O.     I.V. (mL/kg)     Total Intake(mL/kg)     Urine (mL/kg/hr) 2700 (1.1)    Blood     Total Output 2700     Net -2700            LABS:  Recent Labs  04/23/15 0915 04/24/15 0605  WBC 6.8 10.3  HGB 11.3* 10.0*  HCT 32.9* 29.0*  PLT 190 173    Blood type: --/--/B POS, B POS (11/04 0915) Rubella:      Physical Exam:  General: alert, cooperative and no distress CV: Regular rate and rhythm Resp: CTA bilaterally Abdomen: soft, nontender, normal bowel sounds Uterine Fundus: firm, below umbilicus, nontender Incision: Covered with Tegaderm and honeycomb dressing; no significant drainage, edema, bruising, or erythema; well approximated with staples Lochia: minimal Ext: edema trace LE and Homans sign is negative, no sign of DVT   Assessment/: POD # 2/ G4P1000/ S/P C/Section & BTL d/t repeat, ICP ICP, delivered Rubella non-immune Doing well  Plan: Walk in halls TID Warm liquids Continue routine post op orders Anticipate discharge home tomorrow MMR before discharge   Signed: Julianne Handler, Delane Ginger, MSN, CNM 04/25/2015, 9:22 AM

## 2015-04-26 ENCOUNTER — Encounter (HOSPITAL_COMMUNITY): Payer: Self-pay | Admitting: Obstetrics and Gynecology

## 2015-04-26 MED ORDER — OXYCODONE-ACETAMINOPHEN 5-325 MG PO TABS: 1.0000 | ORAL_TABLET | ORAL | Status: AC | PRN

## 2015-04-26 MED ORDER — FERROUS SULFATE 325 (65 FE) MG PO TABS: 325.0000 mg | ORAL_TABLET | Freq: Every day | ORAL | Status: AC

## 2015-04-26 MED ORDER — IBUPROFEN 800 MG PO TABS: 800.0000 mg | ORAL_TABLET | Freq: Three times a day (TID) | ORAL | Status: AC | PRN

## 2015-04-26 NOTE — Progress Notes (Signed)
POSTOPERATIVE DAY # 3 S/P repeat CS / cholestasis of pregnancy   S:         Reports feeling persistent itching remains             Tolerating po intake / no nausea / no vomiting / + flatus / no BM             Bleeding is light             Pain controlled with motrin and percocet - helps some             Up ad lib / ambulatory/ voiding QS  Newborn breast feeding    O:  VS: BP 108/79 mmHg  Pulse 65  Temp(Src) 98.3 F (36.8 C) (Oral)  Resp 16  Ht 5\' 6"  (1.676 m)  Wt 103.42 kg (228 lb)  BMI 36.82 kg/m2  SpO2 98%  LMP 08/06/2014  Breastfeeding? Unknown   LABS:               Recent Labs  04/23/15 0915 04/24/15 0605  WBC 6.8 10.3  HGB 11.3* 10.0*  PLT 190 173               Bloodtype: --/--/B POS, B POS (11/04 0915)  Rubella:     Indeterminate             Declined flu and tdap                                      Physical Exam:             Alert and Oriented X3  Lungs: Clear and unlabored  Heart: regular rate and rhythm / no mumurs  Abdomen: soft, non-tender, non-distended, active bowel sounds             Fundus: firm, non-tender, U-1             Dressing intact honeycomb              Incision:  approximated with staples / no erythema / no ecchymosis / no drainage  Perineum: intact  Lochia: light  Extremities: trace edema, no calf pain or tenderness, negative Homans  A:        POD # 3 S/P repeat CS            ICP  P:        Routine postoperative care              Low fat and actigall- repeat LE Friday at staple removal - DC meds if normal or refer to GI if elevated             DC home - WOB booklet - instructions reviewed     Marlinda MikeBAILEY, TANYA CNM, MSN, [facility] Hospital[facility]ACNM 04/26/2015, 7:54 AM

## 2015-04-26 NOTE — Discharge Summary (Signed)
POSTOPERATIVE DISCHARGE SUMMARY:  Patient ID: Mindy Stewart MRN: [id number] DOB/AGE: [birth year] 38 y.o.  Admit date: 04/23/2015 Admission Diagnoses: 37.1 weeks / AMA / previous CS x 2 / cholestasis of pregnancy / transverse lie  Discharge date:  11/7/2-016 Discharge Diagnoses: POD 3 s/p repeat CS  Prenatal history: G4P1000   EDC : 05/13/2015, by Last Menstrual Period  Prenatal care at [facility]  Primary provider : Fredric Mare Prenatal course complicated by [facility] / asthma / obesity / 2 previous CS / ICP with elevated LE  Prenatal Labs: ABO, Rh: --/--/B POS, B POS (11/04 0915)  Antibody: NEG (11/04 0915) Rubella:    NON-immune  (declines booster) RPR: Non Reactive (11/04 0915)  HBsAg:   Neg HIV:   NR  Medical / Surgical History :  Past medical history:  Past Medical History  Diagnosis Date  . Complication of anesthesia     problems with epidural- 3 attempts  . Asthma     rare inhaler use  . Postpartum care following cesarean delivery (11/4) 04/23/2015    Past surgical history:  Past Surgical History  Procedure Laterality Date  . Cesarean section      x 2  . Diagnostic laparoscopy      2002    Family History: History reviewed. No pertinent family history.  Social History:  reports that she has never smoked. She does not have any smokeless tobacco history on file. Her alcohol and drug histories are not on file.  Allergies: Peanuts; Prednisone; and Coconut oil   Current Medications at time of admission:  Prior to Admission medications   Medication Sig Start Date End Date Taking? Authorizing Provider  acetaminophen (TYLENOL) 500 MG tablet Take 1,000 mg by mouth every 6 (six) hours as needed for headache.   Yes Historical Provider, MD  albuterol (PROVENTIL HFA;VENTOLIN HFA) 108 (90 BASE) MCG/ACT inhaler Inhale 2 puffs into the lungs every 6 (six) hours as needed for wheezing or shortness of breath.   Yes Historical Provider, MD  cetirizine (ZYRTEC) 10  MG tablet Take 10 mg by mouth daily.   Yes Historical Provider, MD  diphenhydramine-acetaminophen (TYLENOL PM) 25-500 MG TABS tablet Take 1 tablet by mouth at bedtime as needed (sleep).   Yes Historical Provider, MD  Magnesium 500 MG TABS Take 500 mg by mouth daily.   Yes Historical Provider, MD  Prenatal Vit-Fe Fumarate-FA (PRENATAL MULTIVITAMIN) TABS tablet Take 1 tablet by mouth daily at 12 noon.   Yes Historical Provider, MD  ranitidine (ZANTAC) 150 MG capsule Take 150 mg by mouth 2 (two) times daily.   Yes Historical Provider, MD  ursodiol (ACTIGALL) 300 MG capsule Take 300 mg by mouth 3 (three) times daily.   Yes Historical Provider, MD    Procedures: Cesarean section delivery on 04/23/2015 with delivery of viable female newborn by Dr Wonda Olds   See operative report for further details APGAR (1 MIN): 8   APGAR (5 MINS): 9    Postoperative / postpartum course:  Uncomplicated with discharge on POD 3  Discharge Instructions:  Discharged Condition: stable  Activity: pelvic rest and postoperative restrictions x 2   Diet: routine  Medications:    Medication List    STOP taking these medications        diphenhydramine-acetaminophen 25-500 MG Tabs tablet  Commonly known as:  TYLENOL PM      TAKE these medications        acetaminophen 500 MG tablet  Commonly known as:  TYLENOL  Take  1,000 mg by mouth every 6 (six) hours as needed for headache.     albuterol 108 (90 BASE) MCG/ACT inhaler  Commonly known as:  PROVENTIL HFA;VENTOLIN HFA  Inhale 2 puffs into the lungs every 6 (six) hours as needed for wheezing or shortness of breath.     cetirizine 10 MG tablet  Commonly known as:  ZYRTEC  Take 10 mg by mouth daily.     ferrous sulfate 325 (65 FE) MG tablet  Take 1 tablet (325 mg total) by mouth daily with breakfast.     ibuprofen 800 MG tablet  Commonly known as:  ADVIL,MOTRIN  Take 1 tablet (800 mg total) by mouth every 8 (eight) hours as needed for mild pain, moderate  pain or cramping.     Magnesium 500 MG Tabs  Take 500 mg by mouth daily.     oxyCODONE-acetaminophen 5-325 MG tablet  Commonly known as:  PERCOCET/ROXICET  Take 1 tablet by mouth every 4 (four) hours as needed (for pain scale 4-7).     prenatal multivitamin Tabs tablet  Take 1 tablet by mouth daily at 12 noon.     ranitidine 150 MG capsule  Commonly known as:  ZANTAC  Take 150 mg by mouth 2 (two) times daily.     ursodiol 300 MG capsule  Commonly known as:  ACTIGALL  Take 300 mg by mouth 3 (three) times daily.        Wound Care: keep clean and dry / remove honeycomb with staple removal at [facility] on 11/11 Postpartum Instructions: [facility] discharge booklet - instructions reviewed  Discharge to: Home  Follow up :  [facility] in 4 days for interval visit with CNM or nurse for staple removal and labs (CMP) [facility] in 6 weeks for routine postpartum visit with Fredric MareBailey CNM                Signed: Marlinda MikeBAILEY, TANYA CNM, MSN, [facility] 04/26/2015, 9:01 AM

## 2015-04-26 NOTE — Lactation Note (Signed)
This note was copied from the chart of Boy Darrell Jewelamara Sabo-Thayer. Lactation Consultation Note  Patient Name: Boy Darrell Jewelamara Sabo-Thayer ZOXWR'UToday's Date: 04/26/2015 Reason for consult: Follow-up assessment Mom reports baby cluster feeding this am. She reports her breast are filling. Mom had just latched baby prior to my visit. Baby had lips well flanged but not very wide gape, Mom's nipple had slight crease across when baby came off the breast. Baby re-latched during the visit and had wider gape with this latch. Good suckling bursts with some swallows noted. Mom considering referral for evaluation of "tongue-tie" but will discuss with Peds. LC encouraged Mom to schedule OP f/u later this week or next week for pre/post weight check to assess milk transfer and comfort with BF. Reviewed basic teaching, advised Mom to BF with feeding ques, at least 8-12 times in 24 hours. Keep baby nursing for 15-20 minutes both breasts. If baby still acting hungry, consider hand expressing or post pumping giving baby back EBM received. Advised Mom this will conserve calorie usage with feedings. Mom spoon feeding supplement prn. LC gave Mom foley cup to use.  Engorgement care reviewed if needed. Advised of OP services and support group. Mom to advise if she would like to schedule OP f/u.  Maternal Data    Feeding Feeding Type: Breast Fed Length of feed: 20 min  LATCH Score/Interventions Latch: Grasps breast easily, tongue down, lips flanged, rhythmical sucking.  Audible Swallowing: Spontaneous and intermittent  Type of Nipple: Everted at rest and after stimulation  Comfort (Breast/Nipple): Filling, red/small blisters or bruises, mild/mod discomfort  Problem noted: Filling;Mild/Moderate discomfort  Hold (Positioning): No assistance needed to correctly position infant at breast. Intervention(s): Breastfeeding basics reviewed  LATCH Score: 9  Lactation Tools Discussed/Used Tools: Pump;Feeding cup Breast pump type:  Manual   Consult Status Consult Status: Complete Date: 04/26/15 Follow-up type: In-patient    Alfred LevinsGranger, Yalitza Teed Ann 04/26/2015, 8:54 AM

## 2015-05-03 ENCOUNTER — Encounter (HOSPITAL_COMMUNITY): Payer: Self-pay | Admitting: *Deleted

## 2015-07-23 ENCOUNTER — Other Ambulatory Visit: Payer: Self-pay | Admitting: Physician Assistant

## 2015-07-23 DIAGNOSIS — K831 Obstruction of bile duct: Secondary | ICD-10-CM

## 2015-07-23 DIAGNOSIS — O26619 Liver and biliary tract disorders in pregnancy, unspecified trimester: Principal | ICD-10-CM

## 2015-07-29 ENCOUNTER — Ambulatory Visit (HOSPITAL_COMMUNITY)
Admission: RE | Admit: 2015-07-29 | Discharge: 2015-07-29 | Disposition: A | Discharge status: Home / Self Care | Payer: BLUE CROSS/BLUE SHIELD | Source: Ambulatory Visit | Attending: Obstetrics and Gynecology | Admitting: Obstetrics and Gynecology

## 2015-07-29 NOTE — Lactation Note (Signed)
Lactation Consult  Mother's reason for visit: follow up post revision.  Visit Type:  Feeding assessment. Appointment Notes:  Mother states that she had infants tongue revised on 07/22/15 by Dr Jodelle Red. Mother wants pre and post weights for  reasurance that infant is feeding well.  Consult:  Follow-Up Lactation Consultant:  Michel Bickers   Mother's Name: Amos Gaber Type of delivery:  vaginal del Breastfeeding Experience:  breastfed 2 other children Maternal Medical Conditions:  none that patient voiced,  Maternal Medications: prenatal vits. Mother states that she is not taking any supplements to increase milk supply  ________________________________________________________________________  Breastfeeding History (Post Discharge)  Frequency of breastfeeding: every 3 hours Duration of feeding:  7-20 mins     Infant Intake and Output Assessment  Voids:  8  in 24 hrs.  Color:  Clear yellow Stools:1-2   in 24 hrs.  Color:  Yellow-  ________________________________________________________________________  Maternal Breast Assessment  Breast:  Full Nipple:  Erect Pain level:  0 Pain interventions:  Bra  _______________________________________________________________________ Feeding Assessment/Evaluation:  Mother states that infant has a difficult time when she is doing post revision stretches . She states that she was advised to wake infant in the night to do stretches.  Infant latched on the right breast with a shallow latch. Mother taught to use an off sided latch technique. Infant sustained latch for 15 mins and transferred 114 ml.  Infant very content and happy. I didn't assess infant oral cavity.    Positioning:  Cross cradle Right breast  LATCH documentation:  Latch:  2 = Grasps breast easily, tongue down, lips flanged, rhythmical sucking.  Audible swallowing:  2 = Spontaneous and intermittent  Type of nipple:  2 = Everted at rest and after  stimulation  Comfort (Breast/Nipple):  1 = Filling, red/small blisters or bruises, mild/mod discomfort  Hold (Positioning):  1 = Assistance needed to correctly position infant at breast and maintain latch  LATCH score:  8  Attached assessment:  Deep  Lips flanged:  No.  Lips untucked:  Yes.    Suck assessment:  Nutritive  Pre-feed weight:  1610,96-04 Post-feed weight:  5409,81-19 Amount transferred: 114 ml   Total amount transferred:  114 ml Advised mother to continue to cue base feed infant Recommend that infant use off sided latch technique  Suggested that mother continue to post pump once daily Mother to follow up with Dr Jodelle Red as scheduled and Dr Eddie Candle for regular follow up visit.  PRN Lactation visit or BFSG

## 2016-02-22 DIAGNOSIS — B370 Candidal stomatitis: Secondary | ICD-10-CM | POA: Diagnosis not present

## 2016-02-22 DIAGNOSIS — Z91018 Allergy to other foods: Secondary | ICD-10-CM | POA: Diagnosis not present

## 2016-02-22 DIAGNOSIS — J45901 Unspecified asthma with (acute) exacerbation: Secondary | ICD-10-CM | POA: Diagnosis not present

## 2016-05-19 DIAGNOSIS — G5600 Carpal tunnel syndrome, unspecified upper limb: Secondary | ICD-10-CM | POA: Diagnosis not present

## 2016-05-19 DIAGNOSIS — M654 Radial styloid tenosynovitis [de Quervain]: Secondary | ICD-10-CM | POA: Diagnosis not present

## 2016-07-25 DIAGNOSIS — M62838 Other muscle spasm: Secondary | ICD-10-CM | POA: Diagnosis not present

## 2016-07-28 ENCOUNTER — Other Ambulatory Visit: Payer: Self-pay | Admitting: Physician Assistant

## 2016-07-28 ENCOUNTER — Ambulatory Visit
Admission: RE | Admit: 2016-07-28 | Discharge: 2016-07-28 | Disposition: A | Discharge status: Home / Self Care | Payer: BLUE CROSS/BLUE SHIELD | Source: Ambulatory Visit | Attending: Physician Assistant | Admitting: Physician Assistant

## 2016-07-28 DIAGNOSIS — M542 Cervicalgia: Secondary | ICD-10-CM

## 2016-07-28 DIAGNOSIS — M545 Low back pain: Secondary | ICD-10-CM | POA: Diagnosis not present

## 2016-07-28 DIAGNOSIS — S3992XA Unspecified injury of lower back, initial encounter: Secondary | ICD-10-CM | POA: Diagnosis not present

## 2016-08-08 ENCOUNTER — Ambulatory Visit: Payer: BLUE CROSS/BLUE SHIELD | Attending: Family Medicine | Admitting: Physical Therapy

## 2016-08-08 ENCOUNTER — Encounter: Payer: Self-pay | Admitting: Physical Therapy

## 2016-08-08 DIAGNOSIS — M62830 Muscle spasm of back: Secondary | ICD-10-CM | POA: Diagnosis not present

## 2016-08-08 DIAGNOSIS — M542 Cervicalgia: Secondary | ICD-10-CM | POA: Diagnosis not present

## 2016-08-08 DIAGNOSIS — M545 Low back pain, unspecified: Secondary | ICD-10-CM
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
## 2016-08-15 ENCOUNTER — Ambulatory Visit: Payer: BLUE CROSS/BLUE SHIELD | Admitting: Physical Therapy

## 2016-08-15 ENCOUNTER — Encounter: Payer: Self-pay | Admitting: Physical Therapy

## 2016-08-15 DIAGNOSIS — M545 Low back pain, unspecified: Secondary | ICD-10-CM

## 2016-08-15 DIAGNOSIS — M62830 Muscle spasm of back: Secondary | ICD-10-CM

## 2016-08-15 DIAGNOSIS — M542 Cervicalgia: Secondary | ICD-10-CM | POA: Diagnosis not present

## 2016-08-15 NOTE — Therapy (Signed)
[facility] [address] [place], [place], [address] Phone: [phone]   Fax:  [phone]  Physical Therapy Treatment  Patient Details  Name: Mindy Stewart MRN: [id number] Date of Birth: 08-28-1975 Referring Provider: Louis Meckel  Encounter Date: 08/15/2016      PT End of Session - 08/15/16 1437    Visit Number 2   Date for PT Re-Evaluation 10/06/16   PT Start Time 1400   PT Stop Time 1450   PT Time Calculation (min) 50 min      Past Medical History:  Diagnosis Date  . Asthma    rare inhaler use  . Complication of anesthesia    problems with epidural- 3 attempts  . Postpartum care following cesarean delivery (11/4) 04/23/2015    Past Surgical History:  Procedure Laterality Date  . CESAREAN SECTION     x 2  . CESAREAN SECTION WITH BILATERAL TUBAL LIGATION Bilateral 04/23/2015   Procedure: CESAREAN SECTION WITH BILATERAL TUBAL LIGATION;  Surgeon: Maxie Better, MD;  Location: [facility];  Service: Obstetrics;  Laterality: Bilateral;  EDDL: 05/13/15 Allergy: Coconut, Tree Nuts, Prednisone  . DIAGNOSTIC LAPAROSCOPY     2002    There were no vitals filed for this visit.      Subjective Assessment - 08/15/16 1401    Subjective new TENS unit on the way, quick or increased mvmt hurts   Currently in Pain? Yes   Pain Score 3    Pain Location Neck                         [facility] Adult PT Treatment/Exercise - 08/15/16 0001      Exercises   Exercises Neck;Lumbar     Neck Exercises: Machines for Strengthening   UBE (Upper Arm Bike) Nustep L 4 5 min     Neck Exercises: Theraband   Scapula Retraction 10 reps;Red  3 ways     Neck Exercises: Standing   Other Standing Exercises 3# shruggs and rolls 15 each     Lumbar Exercises: Standing   Other Standing Lumbar Exercises 3# lat flexion 15 each side  hip ext and abd red tband 15 each leg   Other Standing Lumbar Exercises ball vs wall 5  times CC and 5 CCW     Lumbar Exercises: Supine   Ab Set 15 reps;3 seconds   Bridge 10 reps  and KTC with ball   Large Ball Oblique Isometric 15 reps;3 seconds     Modalities   Modalities Moist Heat;Electrical Stimulation     Moist Heat Therapy   Number Minutes Moist Heat 15 Minutes   Moist Heat Location Lumbar Spine;Cervical     Electrical Stimulation   Electrical Stimulation Location cerv/lumb   Electrical Stimulation Action premod   Electrical Stimulation Goals Pain                PT Education - 08/15/16 1436    Education provided Yes   Education Details taked with pt about decreaing guarding   Person(s) Educated Patient   Methods Explanation;Demonstration   Comprehension Verbalized understanding          PT Short Term Goals - 08/08/16 1443      PT SHORT TERM GOAL #1   Title independent with initial HEP   Time 2   Period Weeks   Status New           PT Long Term Goals -  08/08/16 1443      PT LONG TERM GOAL #1   Title increase lumbar ROM 25%   Time 8   Period Weeks   Status New     PT LONG TERM GOAL #2   Title increase cervical ROM 25%   Time 8   Period Weeks   Status New     PT LONG TERM GOAL #3   Title decrease pain 50%   Time 8   Period Weeks   Status New     PT LONG TERM GOAL #4   Title lift child without any difficulty   Time 8   Period Weeks   Status New               Plan - 08/15/16 1438    Clinical Impression Statement pt agreeable to all therapy interventions and relexed more as we progressed with cuing. pt verb doing HEP from eval   PT Next Visit Plan assess and progress as tolerated      Patient will benefit from skilled therapeutic intervention in order to improve the following deficits and impairments:  Decreased activity tolerance, Decreased range of motion, Decreased strength, Increased fascial restricitons, Increased muscle spasms, Impaired flexibility, Postural dysfunction, Improper body mechanics,  Pain  Visit Diagnosis: Cervicalgia  Acute bilateral low back pain without sciatica  Muscle spasm of back     Problem List Patient Active Problem List   Diagnosis Date Noted  . Postpartum care following cesarean delivery (11/4) 04/23/2015    Gautham Hewins,ANGIE PTA 08/15/2016, 2:40 PM  [facility] [address] [place], [place], [address] Phone: [phone]   Fax:  [phone]  Name: Mindy Stewart MRN: [id number] Date of Birth: 06/09/1976

## 2016-08-17 ENCOUNTER — Ambulatory Visit: Payer: BLUE CROSS/BLUE SHIELD | Admitting: Physical Therapy

## 2016-08-22 ENCOUNTER — Ambulatory Visit: Payer: BLUE CROSS/BLUE SHIELD | Attending: Family Medicine | Admitting: Physical Therapy

## 2016-08-22 ENCOUNTER — Encounter: Payer: Self-pay | Admitting: Physical Therapy

## 2016-08-22 DIAGNOSIS — M545 Low back pain, unspecified: Secondary | ICD-10-CM

## 2016-08-22 DIAGNOSIS — M542 Cervicalgia: Secondary | ICD-10-CM | POA: Diagnosis not present

## 2016-08-22 DIAGNOSIS — M62830 Muscle spasm of back: Secondary | ICD-10-CM | POA: Diagnosis not present

## 2016-08-22 NOTE — Therapy (Signed)
[facility] [address] [place], [place], [address] Phone: [phone]   Fax:  [phone]  Physical Therapy Treatment  Patient Details  Name: Mindy Stewart MRN: [id number] Date of Birth: 02/19/1976 Referring Provider: Louis MeckelJ Mauney  Encounter Date: 08/22/2016      PT End of Session - 08/22/16 1643    Visit Number 3   Date for PT Re-Evaluation 10/06/16   PT Start Time 1600   PT Stop Time 1658   PT Time Calculation (min) 58 min   Activity Tolerance Patient tolerated treatment well   Behavior During Therapy [facility] for tasks assessed/performed      Past Medical History:  Diagnosis Date  . Asthma    rare inhaler use  . Complication of anesthesia    problems with epidural- 3 attempts  . Postpartum care following cesarean delivery (11/4) 04/23/2015    Past Surgical History:  Procedure Laterality Date  . CESAREAN SECTION     x 2  . CESAREAN SECTION WITH BILATERAL TUBAL LIGATION Bilateral 04/23/2015   Procedure: CESAREAN SECTION WITH BILATERAL TUBAL LIGATION;  Surgeon: Maxie BetterSheronette Cousins, MD;  Location: [facility];  Service: Obstetrics;  Laterality: Bilateral;  EDDL: 05/13/15 Allergy: Coconut, Tree Nuts, Prednisone  . DIAGNOSTIC LAPAROSCOPY     2002    There were no vitals filed for this visit.      Subjective Assessment - 08/22/16 1602    Subjective "Im having some soreness today"   Currently in Pain? Yes   Pain Score 3    Pain Location Neck   Pain Orientation Left                         [facility] Adult PT Treatment/Exercise - 08/22/16 0001      Neck Exercises: Machines for Strengthening   UBE (Upper Arm Bike) L2 483frd/3rev     Neck Exercises: Standing   Other Standing Exercises 3# shrugs and rolls 15 each   Other Standing Exercises Tband rows green 2x15; Shoulder ext red tband 2x15      Neck Exercises: Supine   Other Supine Exercise 3 way cervical retractions on physo ball x10      Modalities   Modalities Moist Heat;Electrical Stimulation     Moist Heat Therapy   Number Minutes Moist Heat 15 Minutes   Moist Heat Location Cervical     Electrical Stimulation   Electrical Stimulation Location cerv   Electrical Stimulation Action IFC   Electrical Stimulation Parameters pt tolerance    Electrical Stimulation Goals Pain     Manual Therapy   Manual Therapy Soft tissue mobilization;Passive ROM;Neural Stretch;Manual Traction   Manual therapy comments tenderness in upper traps and posterior paraspinals.   Soft tissue mobilization posterior cervical para spinales   Passive ROM cervical spine   Manual Traction cervical 10 sec x3   Neural Stretch contract relax cervical rotation                  PT Short Term Goals - 08/08/16 1443      PT SHORT TERM GOAL #1   Title independent with initial HEP   Time 2   Period Weeks   Status New           PT Long Term Goals - 08/22/16 1645      PT LONG TERM GOAL #1   Title increase lumbar ROM 25%   Status On-going     PT LONG TERM  GOAL #2   Title increase cervical ROM 25%   Status On-going     PT LONG TERM GOAL #3   Title decrease pain 50%   Status On-going               Plan - 08/22/16 1646    Clinical Impression Statement Pt reports that her low back is improving but her neck has been stiff and painful. today's interventions focuses on postural strengthening and cervical ROM. Pt able to complete all of today's exercises, does reports pain with shoulder shrugs and rolls. Pt reports a feel good pain with Rows. Positive response to MT, does have some tenderness with STM.   Rehab Potential Good   PT Frequency 2x / week   PT Duration 8 weeks   PT Treatment/Interventions ADLs/Self Care Home Management;Cryotherapy;Electrical Stimulation;Moist Heat;Traction;Ultrasound;Functional mobility training;Therapeutic activities;Therapeutic exercise;Patient/family education;Manual techniques   PT Next Visit Plan  assess and progress as tolerated      Patient will benefit from skilled therapeutic intervention in order to improve the following deficits and impairments:  Decreased activity tolerance, Decreased range of motion, Decreased strength, Increased fascial restricitons, Increased muscle spasms, Impaired flexibility, Postural dysfunction, Improper body mechanics, Pain  Visit Diagnosis: Acute bilateral low back pain without sciatica  Muscle spasm of back  Cervicalgia     Problem List Patient Active Problem List   Diagnosis Date Noted  . Postpartum care following cesarean delivery (11/4) 04/23/2015    Mindy Stewart 08/22/2016, 4:48 PM  [facility] [address] [place], [place], [address] Phone: [phone]   Fax:  [phone]  Name: Mindy Stewart MRN: [id number] Date of Birth: [birth year]

## 2016-08-25 ENCOUNTER — Ambulatory Visit: Payer: BLUE CROSS/BLUE SHIELD | Admitting: Physical Therapy

## 2016-08-25 DIAGNOSIS — M62830 Muscle spasm of back: Secondary | ICD-10-CM

## 2016-08-25 DIAGNOSIS — M542 Cervicalgia: Secondary | ICD-10-CM

## 2016-08-25 DIAGNOSIS — M545 Low back pain, unspecified: Secondary | ICD-10-CM

## 2016-08-25 NOTE — Therapy (Signed)
[facility] [address] [place], [place], [address] Phone: [phone]   Fax:  [phone]  Physical Therapy Treatment  Patient Details  Name: Mindy Stewart MRN: [id number] Date of Birth: [birth year] Referring Provider: Louis Meckel  Encounter Date: 08/25/2016      PT End of Session - 08/25/16 1207    Visit Number 4   Date for PT Re-Evaluation 10/06/16   PT Start Time 1100   PT Stop Time 1145   PT Time Calculation (min) 45 min   Activity Tolerance Patient tolerated treatment well   Behavior During Therapy [facility] for tasks assessed/performed      Past Medical History:  Diagnosis Date   Asthma    rare inhaler use   Complication of anesthesia    problems with epidural- 3 attempts   Postpartum care following cesarean delivery (11/4) 04/23/2015    Past Surgical History:  Procedure Laterality Date   CESAREAN SECTION     x 2   CESAREAN SECTION WITH BILATERAL TUBAL LIGATION Bilateral 04/23/2015   Procedure: CESAREAN SECTION WITH BILATERAL TUBAL LIGATION;  Surgeon: Maxie Better, MD;  Location: [facility];  Service: Obstetrics;  Laterality: Bilateral;  EDDL: 05/13/15 Allergy: Coconut, Tree Nuts, Prednisone   DIAGNOSTIC LAPAROSCOPY     2002    There were no vitals filed for this visit.      Subjective Assessment - 08/25/16 1206    Subjective Feeling much better, able to turn her head to the Left with less pain and more ROM.                         [facility] Adult PT Treatment/Exercise - 08/25/16 0001      Self-Care   Other Self-Care Comments  postural control use of L/S towel roll, sitting posture for driving and at computer      Neck Exercises: Theraband   Scapula Retraction 10 reps     Neck Exercises: Supine   Neck Retraction 10 reps;5 secs   Neck Retraction Limitations into pillow w/ chin tuck   Cervical Rotation Left;10 reps   Cervical Rotation Limitations Left decreased 25% vs Right      Manual Therapy   Manual Therapy Soft tissue mobilization;Passive ROM;Neural Stretch;Manual Traction   Manual therapy comments Left C/S upper trap levator trigger point   Soft tissue mobilization posterior cervical para spinales   Passive ROM cervical spine   Manual Traction cervical 10 sec x3     Neck Exercises: Stretches   Upper Trapezius Stretch 3 reps;30 seconds                  PT Short Term Goals - 08/08/16 1443      PT SHORT TERM GOAL #1   Title independent with initial HEP   Time 2   Period Weeks   Status New           PT Long Term Goals - 08/22/16 1645      PT LONG TERM GOAL #1   Title increase lumbar ROM 25%   Status On-going     PT LONG TERM GOAL #2   Title increase cervical ROM 25%   Status On-going     PT LONG TERM GOAL #3   Title decrease pain 50%   Status On-going               Plan - 08/25/16 1208    Clinical Impression Statement Cont with  trigger point throughout Left upper trap, levator scap, and C/S T/S junction.  Will need further posutral correction/ awareness activities and strengthening. Encouraged use of L/S towel roll for sitting posture.  Excellent response to manual techniques with increased C/S rot Left as a result.       Patient will benefit from skilled therapeutic intervention in order to improve the following deficits and impairments:     Visit Diagnosis: Acute bilateral low back pain without sciatica  Muscle spasm of back  Cervicalgia     Problem List Patient Active Problem List   Diagnosis Date Noted   Postpartum care following cesarean delivery (11/4) 04/23/2015    Tomie ChinaLarry C Clements, PTA 08/25/2016, 12:12 PM  [facility] [address] [place], [place], [address] Phone: [phone]   Fax:  [phone]  Name: Mindy Stewart MRN: [id number] Date of Birth: 10/19/1975

## 2016-08-29 ENCOUNTER — Ambulatory Visit: Payer: BLUE CROSS/BLUE SHIELD | Admitting: Physical Therapy

## 2016-08-31 ENCOUNTER — Encounter: Payer: Self-pay | Admitting: Physical Therapy

## 2016-08-31 ENCOUNTER — Ambulatory Visit: Payer: BLUE CROSS/BLUE SHIELD | Admitting: Physical Therapy

## 2016-08-31 DIAGNOSIS — M542 Cervicalgia: Secondary | ICD-10-CM

## 2016-08-31 DIAGNOSIS — M545 Low back pain, unspecified: Secondary | ICD-10-CM

## 2016-08-31 DIAGNOSIS — M62830 Muscle spasm of back: Secondary | ICD-10-CM | POA: Diagnosis not present

## 2016-08-31 NOTE — Therapy (Signed)
[place], [place], [address] Phone: [phone]   Fax:  [phone]  Physical Therapy Treatment  Patient Details  Name: Mindy Stewart MRN: [id number] Date of Birth: 12-10-1975 Referring Provider: Amaryllis Dyke  Encounter Date: 08/31/2016      PT End of Session - 08/31/16 1342    Visit Number 5   Date for PT Re-Evaluation 10/06/16   PT Start Time 1300   PT Stop Time 1355   PT Time Calculation (min) 55 min   Activity Tolerance Patient tolerated treatment well   Behavior During Therapy [facility] for tasks assessed/performed      Past Medical History:  Diagnosis Date  . Asthma    rare inhaler use  . Complication of anesthesia    problems with epidural- 3 attempts  . Postpartum care following cesarean delivery (11/4) 04/23/2015    Past Surgical History:  Procedure Laterality Date  . CESAREAN SECTION     x 2  . CESAREAN SECTION WITH BILATERAL TUBAL LIGATION Bilateral 04/23/2015   Procedure: CESAREAN SECTION WITH BILATERAL TUBAL LIGATION;  Surgeon: Servando Salina, MD;  Location: [facility];  Service: Obstetrics;  Laterality: Bilateral;  EDDL: 05/13/15 Allergy: Coconut, Tree Nuts, Prednisone  . DIAGNOSTIC LAPAROSCOPY     2002    There were no vitals filed for this visit.      Subjective Assessment - 08/31/16 1301    Subjective "Better than th last time I saw you, Not much pain a little discomfort when move"   Currently in Pain? Yes   Pain Score 1    Pain Location Neck   Pain Orientation Left                         [facility] Adult PT Treatment/Exercise - 08/31/16 0001      Neck Exercises: Machines for Strengthening   UBE (Upper Arm Bike) L4 55fd/3rev     Neck Exercises: Standing   Other Standing Exercises 3# shruggs and rolls 15 each   Other Standing Exercises Tband rows blue 2x15; Shoulder ext red tband 2x15      Neck Exercises: Seated   Other Seated Exercise Rows and  Lats 20lb 2x10      Neck Exercises: Supine   Neck Retraction 10 reps;5 secs   Neck Retraction Limitations into pillow w/ chin tuck     Modalities   Modalities Moist Heat;Electrical Stimulation     Moist Heat Therapy   Number Minutes Moist Heat 15 Minutes   Moist Heat Location Cervical     Electrical Stimulation   Electrical Stimulation Location cerv   Electrical Stimulation Action IFC   Electrical Stimulation Parameters pt tolerance   Electrical Stimulation Goals Pain     Manual Therapy   Manual Therapy Soft tissue mobilization;Passive ROM;Neural Stretch;Manual Traction   Manual therapy comments Left C/S upper trap levator trigger point   Soft tissue mobilization posterior cervical para spinales   Passive ROM cervical spine   Manual Traction cervical 10 sec x3                  PT Short Term Goals - 08/08/16 1443      PT SHORT TERM GOAL #1   Title independent with initial HEP   Time 2   Period Weeks   Status New           PT Long Term Goals - 08/31/16 1342  PT LONG TERM GOAL #1   Title increase lumbar ROM 25%   Status Achieved     PT LONG TERM GOAL #2   Title increase cervical ROM 25%   Status Partially Met     PT LONG TERM GOAL #3   Title decrease pain 50%   Status Partially Met     PT LONG TERM GOAL #4   Title lift child without any difficulty   Status Achieved               Plan - 08/31/16 1343    Clinical Impression Statement Pt tolerated treatment well and reports an overall improvement. Pt with a positive response to MT. Reporting I can now turn my head all the way to the L without pain.    Rehab Potential Good   PT Frequency 2x / week   PT Duration 8 weeks   PT Treatment/Interventions ADLs/Self Care Home Management;Cryotherapy;Electrical Stimulation;Moist Heat;Traction;Ultrasound;Functional mobility training;Therapeutic activities;Therapeutic exercise;Patient/family education;Manual techniques   PT Next Visit Plan assess  and progress as tolerated      Patient will benefit from skilled therapeutic intervention in order to improve the following deficits and impairments:  Decreased activity tolerance, Decreased range of motion, Decreased strength, Increased fascial restricitons, Increased muscle spasms, Impaired flexibility, Postural dysfunction, Improper body mechanics, Pain  Visit Diagnosis: Acute bilateral low back pain without sciatica  Cervicalgia  Muscle spasm of back     Problem List Patient Active Problem List   Diagnosis Date Noted  . Postpartum care following cesarean delivery (11/4) 04/23/2015    Scot Jun, PTA 08/31/2016, 1:46 PM  [place], [place], [address] Phone: [phone]   Fax:  [phone]  Name: Ranita Stjulien MRN: [id number] Date of Birth: [birth year]

## 2016-09-18 ENCOUNTER — Ambulatory Visit: Payer: No Typology Code available for payment source | Attending: Family Medicine | Admitting: Physical Therapy

## 2016-09-18 ENCOUNTER — Encounter: Payer: Self-pay | Admitting: Physical Therapy

## 2016-09-18 DIAGNOSIS — M542 Cervicalgia: Secondary | ICD-10-CM | POA: Diagnosis not present

## 2016-09-18 DIAGNOSIS — M545 Low back pain, unspecified: Secondary | ICD-10-CM

## 2016-09-18 DIAGNOSIS — M62830 Muscle spasm of back: Secondary | ICD-10-CM

## 2016-09-18 NOTE — Therapy (Signed)
[place], [place], [address] Phone: [phone]   Fax:  [phone]  Physical Therapy Treatment  Patient Details  Name: Mindy Stewart MRN: [id number] Date of Birth: 17-Mar-1976 Referring Provider: Amaryllis Dyke  Encounter Date: 09/18/2016      PT End of Session - 09/18/16 1342    Visit Number 6   Date for PT Re-Evaluation 10/06/16   PT Start Time 1300   PT Stop Time 1354   PT Time Calculation (min) 54 min   Activity Tolerance Patient tolerated treatment well   Behavior During Therapy [facility] for tasks assessed/performed      Past Medical History:  Diagnosis Date  . Asthma    rare inhaler use  . Complication of anesthesia    problems with epidural- 3 attempts  . Postpartum care following cesarean delivery (11/4) 04/23/2015    Past Surgical History:  Procedure Laterality Date  . CESAREAN SECTION     x 2  . CESAREAN SECTION WITH BILATERAL TUBAL LIGATION Bilateral 04/23/2015   Procedure: CESAREAN SECTION WITH BILATERAL TUBAL LIGATION;  Surgeon: Servando Salina, MD;  Location: [facility];  Service: Obstetrics;  Laterality: Bilateral;  EDDL: 05/13/15 Allergy: Coconut, Tree Nuts, Prednisone  . DIAGNOSTIC LAPAROSCOPY     2002    There were no vitals filed for this visit.      Subjective Assessment - 09/18/16 1259    Subjective "I  doing better but, I can tell that I missed a few weeks "   Currently in Pain? No/denies   Pain Score 0-No pain  tightness                         [facility] Adult PT Treatment/Exercise - 09/18/16 0001      Exercises   Exercises Lumbar;Neck     Neck Exercises: Machines for Strengthening   UBE (Upper Arm Bike) L4 56fd/3rev   Cybex Row 25lb 2x210    Other Machines for Strengthening Lat pull downs 25lb 2x10      Neck Exercises: Standing   Other Standing Exercises 3# shruggs and rolls 15 each; Standing ER eith Red band 2x10   Other Standing  Exercises Tband rows blue 2x15; Shoulder ext red tband 2x15      Neck Exercises: Supine   Neck Retraction 5 secs;15 reps  3 way    Neck Retraction Limitations into pillow w/ chin tuck     Modalities   Modalities Moist Heat;Electrical Stimulation     Moist Heat Therapy   Number Minutes Moist Heat 15 Minutes   Moist Heat Location Cervical     Electrical Stimulation   Electrical Stimulation Location cerv   Electrical Stimulation Action IFC   Electrical Stimulation Parameters pt tolerance   Electrical Stimulation Goals Pain     Manual Therapy   Manual Therapy Soft tissue mobilization;Passive ROM;Manual Traction   Manual therapy comments Left C/S upper trap levator trigger point   Soft tissue mobilization posterior cervical para spinales   Passive ROM cervical spine   Manual Traction cervical 10 sec x3                  PT Short Term Goals - 08/08/16 1443      PT SHORT TERM GOAL #1   Title independent with initial HEP   Time 2   Period Weeks   Status New           PT  Long Term Goals - 09/18/16 1346      PT LONG TERM GOAL #1   Title increase lumbar ROM 25%   Status Achieved     PT LONG TERM GOAL #2   Title increase cervical ROM 25%   Status Partially Met     PT LONG TERM GOAL #3   Title decrease pain 50%   Status Partially Met     PT LONG TERM GOAL #4   Title lift child without any difficulty   Status Achieved               Plan - 09/18/16 1343    Clinical Impression Statement Pt enters clinic reporting increase tightness in her neck. Pt reports that she had to look down at her phone  for an extended period of time yesterday causing more tightness. Tolerated all exercises well with no reports of increase pain. Does reports some tenderness in posterior para spinales with ME>   Rehab Potential Good   PT Frequency 2x / week   PT Duration 8 weeks   PT Treatment/Interventions ADLs/Self Care Home Management;Cryotherapy;Electrical  Stimulation;Moist Heat;Traction;Ultrasound;Functional mobility training;Therapeutic activities;Therapeutic exercise;Patient/family education;Manual techniques   PT Next Visit Plan assess and progress as tolerated      Patient will benefit from skilled therapeutic intervention in order to improve the following deficits and impairments:  Decreased activity tolerance, Decreased range of motion, Decreased strength, Increased fascial restricitons, Increased muscle spasms, Impaired flexibility, Postural dysfunction, Improper body mechanics, Pain  Visit Diagnosis: Acute bilateral low back pain without sciatica  Cervicalgia  Muscle spasm of back     Problem List Patient Active Problem List   Diagnosis Date Noted  . Postpartum care following cesarean delivery (11/4) 04/23/2015    Scot Jun, PTA 09/18/2016, 1:48 PM  [place], [place], [address] Phone: [phone]   Fax:  [phone]  Name: Appolonia Ackert MRN: [id number] Date of Birth: 1976-05-15

## 2016-09-25 ENCOUNTER — Ambulatory Visit: Payer: No Typology Code available for payment source | Admitting: Physical Therapy

## 2016-09-25 ENCOUNTER — Encounter: Payer: Self-pay | Admitting: Physical Therapy

## 2016-09-25 DIAGNOSIS — M62830 Muscle spasm of back: Secondary | ICD-10-CM

## 2016-09-25 DIAGNOSIS — M545 Low back pain, unspecified: Secondary | ICD-10-CM

## 2016-09-25 DIAGNOSIS — M542 Cervicalgia: Secondary | ICD-10-CM

## 2016-09-25 NOTE — Therapy (Signed)
[place], [place], [address] Phone: [phone]   Fax:  [phone]  Physical Therapy Treatment  Patient Details  Name: Mindy Stewart MRN: [id number] Date of Birth: [birth year] Referring Provider: Amaryllis Dyke  Encounter Date: 09/25/2016      PT End of Session - 09/25/16 1427    Visit Number 7   Date for PT Re-Evaluation 10/06/16   PT Start Time 1345   PT Stop Time 1428   PT Time Calculation (min) 43 min   Activity Tolerance Patient tolerated treatment well   Behavior During Therapy [facility] for tasks assessed/performed      Past Medical History:  Diagnosis Date  . Asthma    rare inhaler use  . Complication of anesthesia    problems with epidural- 3 attempts  . Postpartum care following cesarean delivery (11/4) 04/23/2015    Past Surgical History:  Procedure Laterality Date  . CESAREAN SECTION     x 2  . CESAREAN SECTION WITH BILATERAL TUBAL LIGATION Bilateral 04/23/2015   Procedure: CESAREAN SECTION WITH BILATERAL TUBAL LIGATION;  Surgeon: Servando Salina, MD;  Location: [facility];  Service: Obstetrics;  Laterality: Bilateral;  EDDL: 05/13/15 Allergy: Coconut, Tree Nuts, Prednisone  . DIAGNOSTIC LAPAROSCOPY     2002    There were no vitals filed for this visit.      Subjective Assessment - 09/25/16 1345    Subjective "Good, tight, we went out of town this weekend, different pillow different bed" "doing ok Just tight today"   Currently in Pain? No/denies   Pain Score 0-No pain                         [facility] Adult PT Treatment/Exercise - 09/25/16 0001      Neck Exercises: Machines for Strengthening   UBE (Upper Arm Bike) L6 62fd/3rev   Other Machines for Strengthening Lat pull downs 25lb 2x10      Neck Exercises: Standing   Wall Push Ups 20 reps   Other Standing Exercises Lv stretch with shrug 4lb ; Standing ER with Red band 2x15   Other Standing Exercises red  Tband horzo shoulder abd 2x10     Lumbar Exercises: Standing   Row Theraband;Both;20 reps   Theraband Level (Row) --  level 5 (Black)   Other Standing Lumbar Exercises ball vs wall 5 times CC and 5 CCW     Modalities   Modalities Ultrasound     Ultrasound   Ultrasound Location R trap    Ultrasound Parameters 1MHz 1.1w/cm2   Ultrasound Goals Pain;Other (Comment)  tightness                  PT Short Term Goals - 08/08/16 1443      PT SHORT TERM GOAL #1   Title independent with initial HEP   Time 2   Period Weeks   Status New           PT Long Term Goals - 09/25/16 1428      PT LONG TERM GOAL #1   Title increase lumbar ROM 25%   Status Achieved     PT LONG TERM GOAL #2   Title increase cervical ROM 25%   Status Partially Met     PT LONG TERM GOAL #3   Title decrease pain 50%   Status Partially Met     PT LONG TERM GOAL #4   Title lift  child without any difficulty   Status Achieved               Plan - 09/25/16 1429    Clinical Impression Statement Pt reports only tightness in neck and shoulder area. Pt with increase tightness with horizontal abduction. Pt reports no major issues with last week beach trip. Positive response to [place].   Rehab Potential Good   PT Frequency 2x / week   PT Duration 8 weeks   PT Treatment/Interventions ADLs/Self Care Home Management;Cryotherapy;Electrical Stimulation;Moist Heat;Traction;Ultrasound;Functional mobility training;Therapeutic activities;Therapeutic exercise;Patient/family education;Manual techniques   PT Next Visit Plan assess and progress as tolerated      Patient will benefit from skilled therapeutic intervention in order to improve the following deficits and impairments:  Decreased activity tolerance, Decreased range of motion, Decreased strength, Increased fascial restricitons, Increased muscle spasms, Impaired flexibility, Postural dysfunction, Improper body mechanics, Pain  Visit Diagnosis: Acute  bilateral low back pain without sciatica  Cervicalgia  Muscle spasm of back     Problem List Patient Active Problem List   Diagnosis Date Noted  . Postpartum care following cesarean delivery (11/4) 04/23/2015    Scot Jun, PTA 09/25/2016, 2:37 PM  [place], [place], [address] Phone: [phone]   Fax:  [phone]  Name: Mindy Stewart MRN: [id number] Date of Birth: Dec 27, 1975

## 2016-09-28 ENCOUNTER — Ambulatory Visit: Payer: No Typology Code available for payment source | Admitting: Physical Therapy

## 2016-09-28 ENCOUNTER — Encounter: Payer: Self-pay | Admitting: Physical Therapy

## 2016-09-28 DIAGNOSIS — M542 Cervicalgia: Secondary | ICD-10-CM | POA: Diagnosis not present

## 2016-09-28 DIAGNOSIS — M545 Low back pain, unspecified: Secondary | ICD-10-CM

## 2016-09-28 DIAGNOSIS — M62830 Muscle spasm of back: Secondary | ICD-10-CM

## 2016-09-28 NOTE — Therapy (Signed)
[place], [place], [address] Phone: [phone]   Fax:  [phone]  Physical Therapy Treatment  Patient Details  Name: Mindy Stewart MRN: [id number] Date of Birth: [birth year] Referring Provider: Amaryllis Dyke  Encounter Date: 09/28/2016      PT End of Session - 09/28/16 1549    Visit Number 8   Date for PT Re-Evaluation 10/06/16   PT Start Time 1345   PT Stop Time 1437   PT Time Calculation (min) 52 min   Activity Tolerance Patient tolerated treatment well   Behavior During Therapy [facility] for tasks assessed/performed      Past Medical History:  Diagnosis Date  . Asthma    rare inhaler use  . Complication of anesthesia    problems with epidural- 3 attempts  . Postpartum care following cesarean delivery (11/4) 04/23/2015    Past Surgical History:  Procedure Laterality Date  . CESAREAN SECTION     x 2  . CESAREAN SECTION WITH BILATERAL TUBAL LIGATION Bilateral 04/23/2015   Procedure: CESAREAN SECTION WITH BILATERAL TUBAL LIGATION;  Surgeon: Servando Salina, MD;  Location: [facility];  Service: Obstetrics;  Laterality: Bilateral;  EDDL: 05/13/15 Allergy: Coconut, Tree Nuts, Prednisone  . DIAGNOSTIC LAPAROSCOPY     2002    There were no vitals filed for this visit.      Subjective Assessment - 09/28/16 1346    Subjective Pt. doing well. Little tightness and a little pain in neck on the right side. Pt said that she really enjoyed [place] last time and feels that she had the best results with it.    Currently in Pain? Yes   Pain Score 1    Pain Location Neck                         [facility] Adult PT Treatment/Exercise - 09/28/16 0001      Neck Exercises: Machines for Strengthening   UBE (Upper Arm Bike) L6 1fd/3rev   Cybex Row 25lb 2x10    Other Machines for Strengthening Lat pull downs 25lb 2x10      Neck Exercises: Standing   Other Standing Exercises shruggs with 4lbs    Other Standing Exercises red theraband shoulder extension 2x15, lateral raises with 3lbs     Neck Exercises: Seated   Other Seated Exercise chops with red ball 2x10 to each side, overhead press 2x10 yellow ball   Other Seated Exercise Rows with theraband 2x15     Lumbar Exercises: Standing   Other Standing Lumbar Exercises standing back extensions 2x10, ball vs wall 5 CW 5CCW     Ultrasound   Ultrasound Location R. trap   Ultrasound Parameters 1MHz 1.1W/cm2   Ultrasound Goals Pain;Other (Comment)     Manual Therapy   Manual Therapy Soft tissue mobilization;Passive ROM;Manual Traction   Manual therapy comments Left C/S upper trap levator trigger point                  PT Short Term Goals - 08/08/16 1443      PT SHORT TERM GOAL #1   Title independent with initial HEP   Time 2   Period Weeks   Status New           PT Long Term Goals - 09/25/16 1428      PT LONG TERM GOAL #1   Title increase lumbar ROM 25%   Status Achieved  PT LONG TERM GOAL #2   Title increase cervical ROM 25%   Status Partially Met     PT LONG TERM GOAL #3   Title decrease pain 50%   Status Partially Met     PT LONG TERM GOAL #4   Title lift child without any difficulty   Status Achieved               Plan - 09/28/16 1552    Clinical Impression Statement Pt reported tightness after exercises. Only reported increase pain during cervical rotation. Requested [place] and reported increase stretch in cervical rotation to the right   Rehab Potential Good   PT Duration 8 weeks   PT Treatment/Interventions ADLs/Self Care Home Management;Cryotherapy;Electrical Stimulation;Moist Heat;Traction;Ultrasound;Functional mobility training;Therapeutic activities;Therapeutic exercise;Patient/family education;Manual techniques   PT Next Visit Plan assess and progress as tolerated      Patient will benefit from skilled therapeutic intervention in order to improve the following deficits and  impairments:  Decreased activity tolerance, Decreased range of motion, Decreased strength, Increased fascial restricitons, Increased muscle spasms, Impaired flexibility, Postural dysfunction, Improper body mechanics, Pain  Visit Diagnosis: Acute bilateral low back pain without sciatica  Cervicalgia  Muscle spasm of back     Problem List Patient Active Problem List   Diagnosis Date Noted  . Postpartum care following cesarean delivery (11/4) 04/23/2015    Octavia Bruckner 09/28/2016, [place], [place], [address] Phone: [phone]   Fax:  [phone]  Name: Amarachi Kotz MRN: [id number] Date of Birth: 11-19-1975

## 2016-10-02 ENCOUNTER — Encounter: Payer: Self-pay | Admitting: Physical Therapy

## 2016-10-02 ENCOUNTER — Ambulatory Visit: Payer: No Typology Code available for payment source | Admitting: Physical Therapy

## 2016-10-02 DIAGNOSIS — M62830 Muscle spasm of back: Secondary | ICD-10-CM

## 2016-10-02 DIAGNOSIS — M542 Cervicalgia: Secondary | ICD-10-CM

## 2016-10-02 DIAGNOSIS — M545 Low back pain, unspecified: Secondary | ICD-10-CM

## 2016-10-02 NOTE — Therapy (Signed)
[place], [place], [address] Phone: [phone]   Fax:  [phone]  Physical Therapy Treatment  Patient Details  Name: Mindy Stewart MRN: [id number] Date of Birth: Jan 12, 1976 Referring Provider: Amaryllis Dyke  Encounter Date: 10/02/2016      PT End of Session - 10/02/16 1428    Visit Number 9   Date for PT Re-Evaluation 10/06/16   PT Start Time 1345   PT Stop Time 1441   PT Time Calculation (min) 56 min   Activity Tolerance Patient tolerated treatment well   Behavior During Therapy [facility] for tasks assessed/performed      Past Medical History:  Diagnosis Date  . Asthma    rare inhaler use  . Complication of anesthesia    problems with epidural- 3 attempts  . Postpartum care following cesarean delivery (11/4) 04/23/2015    Past Surgical History:  Procedure Laterality Date  . CESAREAN SECTION     x 2  . CESAREAN SECTION WITH BILATERAL TUBAL LIGATION Bilateral 04/23/2015   Procedure: CESAREAN SECTION WITH BILATERAL TUBAL LIGATION;  Surgeon: Servando Salina, MD;  Location: [facility];  Service: Obstetrics;  Laterality: Bilateral;  EDDL: 05/13/15 Allergy: Coconut, Tree Nuts, Prednisone  . DIAGNOSTIC LAPAROSCOPY     2002    There were no vitals filed for this visit.      Subjective Assessment - 10/02/16 1344    Subjective Pt. stated that she is feeling very discourage because she has pain again in her neck and rates it as a 3. She stated that she started having pain again this weekend and believes that the cause may be from sleeping wrong.  Also stated that her back was doing alot better.   Currently in Pain? Yes   Pain Score 3                          [facility] Adult PT Treatment/Exercise - 10/02/16 0001      Neck Exercises: Machines for Strengthening   UBE (Upper Arm Bike) L3 48fd/3rev   Cybex Row 25lb 2x10    Other Machines for Strengthening Lat pull downs 25lb 2x10       Neck Exercises: Standing   Other Standing Exercises lateral raises with 2lbs, 2x10   Other Standing Exercises red theraband horizontal abduction 2X10     Neck Exercises: Seated   Other Seated Exercise ER with red theraband    Other Seated Exercise Rows with theraband 2x15     Lumbar Exercises: Standing   Other Standing Lumbar Exercises Wall push ups with  ball 2x10     Lumbar Exercises: Supine   Other Supine Lumbar Exercises overhead back extension  2lbs     Lumbar Exercises: Quadruped   Single Arm Raise 10 reps   Opposite Arm/Leg Raise 10 reps     Moist Heat Therapy   Number Minutes Moist Heat 15 Minutes   Moist Heat Location Cervical     Electrical Stimulation   Electrical Stimulation Location cerv   Electrical Stimulation Action IFC   Electrical Stimulation Parameters pt tolerance   Electrical Stimulation Goals Pain     Manual Therapy   Manual Therapy Soft tissue mobilization;Passive ROM                  PT Short Term Goals - 08/08/16 1443      PT SHORT TERM GOAL #1   Title independent with initial  HEP   Time 2   Period Weeks   Status New           PT Long Term Goals - 09/25/16 1428      PT LONG TERM GOAL #1   Title increase lumbar ROM 25%   Status Achieved     PT LONG TERM GOAL #2   Title increase cervical ROM 25%   Status Partially Met     PT LONG TERM GOAL #3   Title decrease pain 50%   Status Partially Met     PT LONG TERM GOAL #4   Title lift child without any difficulty   Status Achieved               Plan - 10/02/16 1430    Clinical Impression Statement Pt completed exercises well. Reported tightness in left side or neck and pain in right. Reported that [place] didn't help and wanted to go back to estim. Pt. stated that she could tell that the exercises helped today and didn't cause any increase in pain.    Rehab Potential Good   PT Frequency 2x / week   PT Treatment/Interventions ADLs/Self Care Home  Management;Cryotherapy;Electrical Stimulation;Moist Heat;Traction;Ultrasound;Functional mobility training;Therapeutic activities;Therapeutic exercise;Patient/family education;Manual techniques   PT Next Visit Plan assess and progress as tolerated   Consulted and Agree with Plan of Care Patient      Patient will benefit from skilled therapeutic intervention in order to improve the following deficits and impairments:  Decreased activity tolerance, Decreased range of motion, Decreased strength, Increased fascial restricitons, Increased muscle spasms, Impaired flexibility, Postural dysfunction, Improper body mechanics, Pain  Visit Diagnosis: Acute bilateral low back pain without sciatica  Cervicalgia  Muscle spasm of back     Problem List Patient Active Problem List   Diagnosis Date Noted  . Postpartum care following cesarean delivery (11/4) 04/23/2015    Octavia Bruckner 10/02/2016, 2:34 PM  [place], [place], [address] Phone: [phone]   Fax:  [phone]  Name: Jannine Abreu MRN: [id number] Date of Birth: [birth year]

## 2016-10-09 ENCOUNTER — Encounter: Payer: Self-pay | Admitting: Physical Therapy

## 2016-10-09 ENCOUNTER — Ambulatory Visit: Payer: No Typology Code available for payment source | Admitting: Physical Therapy

## 2016-10-09 DIAGNOSIS — M542 Cervicalgia: Secondary | ICD-10-CM | POA: Diagnosis not present

## 2016-10-09 DIAGNOSIS — M545 Low back pain, unspecified: Secondary | ICD-10-CM

## 2016-10-09 DIAGNOSIS — M62830 Muscle spasm of back: Secondary | ICD-10-CM

## 2016-10-09 NOTE — Therapy (Signed)
[place], [place], [address] Phone: [phone]   Fax:  [phone]  Physical Therapy Treatment  Patient Details  Name: Mindy Stewart MRN: [id number] Date of Birth: 1975/11/27 Referring Provider: Amaryllis Dyke  Encounter Date: 10/09/2016      PT End of Session - 10/09/16 1433    Visit Number 10   PT Start Time 8938   PT Stop Time 1430   PT Time Calculation (min) 47 min   Activity Tolerance Patient tolerated treatment well   Behavior During Therapy [facility] for tasks assessed/performed      Past Medical History:  Diagnosis Date  . Asthma    rare inhaler use  . Complication of anesthesia    problems with epidural- 3 attempts  . Postpartum care following cesarean delivery (11/4) 04/23/2015    Past Surgical History:  Procedure Laterality Date  . CESAREAN SECTION     x 2  . CESAREAN SECTION WITH BILATERAL TUBAL LIGATION Bilateral 04/23/2015   Procedure: CESAREAN SECTION WITH BILATERAL TUBAL LIGATION;  Surgeon: Servando Salina, MD;  Location: [facility];  Service: Obstetrics;  Laterality: Bilateral;  EDDL: 05/13/15 Allergy: Coconut, Tree Nuts, Prednisone  . DIAGNOSTIC LAPAROSCOPY     2002    There were no vitals filed for this visit.      Subjective Assessment - 10/09/16 1343    Subjective "I feel like I cant get past this"    Currently in Pain? Yes   Pain Score 3    Pain Location --  Neck, shoulder, head   Multiple Pain Sites Yes                         [facility] Adult PT Treatment/Exercise - 10/09/16 0001      Neck Exercises: Machines for Strengthening   UBE (Upper Arm Bike) L3 70fd/3rev   Cybex Row 25lb 2x10    Other Machines for Strengthening Lat pull downs 25lb 2x10      Neck Exercises: Supine   Neck Retraction 10 reps;3 secs  3 way     Ultrasound   Ultrasound Location R trap   Ultrasound Parameters 1MHz 1.1w/cm2   Ultrasound Goals Pain;Other (Comment)  deep  heat      Manual Therapy   Manual Therapy Soft tissue mobilization;Passive ROM;Manual Traction   Soft tissue mobilization posterior cervical para spinales   Passive ROM cervical spine   Manual Traction cervical 10 sec x3                  PT Short Term Goals - 08/08/16 1443      PT SHORT TERM GOAL #1   Title independent with initial HEP   Time 2   Period Weeks   Status New           PT Long Term Goals - 09/25/16 1428      PT LONG TERM GOAL #1   Title increase lumbar ROM 25%   Status Achieved     PT LONG TERM GOAL #2   Title increase cervical ROM 25%   Status Partially Met     PT LONG TERM GOAL #3   Title decrease pain 50%   Status Partially Met     PT LONG TERM GOAL #4   Title lift child without any difficulty   Status Achieved               Plan - 10/09/16 1434  Clinical Impression Statement Pt reports that some days are better than others, Pt reports some tightness in upper traps that goes into her neck. No increase in symptoms with today's exercises. Reports some tenderness in posterior neck with STM.   PT Frequency 2x / week   PT Duration 8 weeks   PT Treatment/Interventions ADLs/Self Care Home Management;Cryotherapy;Electrical Stimulation;Moist Heat;Traction;Ultrasound;Functional mobility training;Therapeutic activities;Therapeutic exercise;Patient/family education;Manual techniques   PT Next Visit Plan assess and progress as tolerated, attempted [place] again today      Patient will benefit from skilled therapeutic intervention in order to improve the following deficits and impairments:  Decreased activity tolerance, Decreased range of motion, Decreased strength, Increased fascial restricitons, Increased muscle spasms, Impaired flexibility, Postural dysfunction, Improper body mechanics, Pain  Visit Diagnosis: Acute bilateral low back pain without sciatica  Cervicalgia  Muscle spasm of back     Problem List Patient Active Problem List    Diagnosis Date Noted  . Postpartum care following cesarean delivery (11/4) 04/23/2015    Scot Jun, PTA 10/09/2016, 2:37 PM  [place], [place], [address] Phone: [phone]   Fax:  [phone]  Name: Mindy Stewart MRN: [id number] Date of Birth: 06-08-1976

## 2016-10-10 DIAGNOSIS — M545 Low back pain: Secondary | ICD-10-CM | POA: Diagnosis not present

## 2016-10-10 DIAGNOSIS — M542 Cervicalgia: Secondary | ICD-10-CM | POA: Diagnosis not present

## 2016-10-16 ENCOUNTER — Ambulatory Visit: Payer: No Typology Code available for payment source | Admitting: Physical Therapy

## 2016-10-18 ENCOUNTER — Ambulatory Visit: Payer: BLUE CROSS/BLUE SHIELD | Attending: Family Medicine | Admitting: Physical Therapy

## 2016-10-18 DIAGNOSIS — M62830 Muscle spasm of back: Secondary | ICD-10-CM

## 2016-10-18 DIAGNOSIS — M542 Cervicalgia: Secondary | ICD-10-CM

## 2016-10-18 DIAGNOSIS — M545 Low back pain, unspecified: Secondary | ICD-10-CM

## 2016-10-18 NOTE — Therapy (Signed)
[place], [place], [address] Phone: [phone]   Fax:  [phone]  Physical Therapy Treatment  Patient Details  Name: Mindy Stewart MRN: [id number] Date of Birth: [birth year] Referring Provider: Amaryllis Dyke  Encounter Date: 10/18/2016      PT End of Session - 10/18/16 1408    Visit Number 11   PT Start Time 1402   PT Stop Time 1500   PT Time Calculation (min) 58 min   Activity Tolerance Patient tolerated treatment well   Behavior During Therapy [facility] for tasks assessed/performed      Past Medical History:  Diagnosis Date  . Asthma    rare inhaler use  . Complication of anesthesia    problems with epidural- 3 attempts  . Postpartum care following cesarean delivery (11/4) 04/23/2015    Past Surgical History:  Procedure Laterality Date  . CESAREAN SECTION     x 2  . CESAREAN SECTION WITH BILATERAL TUBAL LIGATION Bilateral 04/23/2015   Procedure: CESAREAN SECTION WITH BILATERAL TUBAL LIGATION;  Surgeon: Servando Salina, MD;  Location: [facility];  Service: Obstetrics;  Laterality: Bilateral;  EDDL: 05/13/15 Allergy: Coconut, Tree Nuts, Prednisone  . DIAGNOSTIC LAPAROSCOPY     2002    There were no vitals filed for this visit.      Subjective Assessment - 10/18/16 1404    Subjective Pt. reporting she sleeps on stomach frequently.  Neck is feeling better since starting muscle relaxers.     Patient Stated Goals have less pain   Currently in Pain? Yes   Pain Score 1    Pain Location Neck   Pain Orientation Left   Pain Descriptors / Indicators Aching;Shooting   Pain Type Acute pain   Pain Radiating Towards Rotation at neck    Pain Frequency Constant   Multiple Pain Sites No                         [facility] Adult PT Treatment/Exercise - 10/18/16 1415      Neck Exercises: Machines for Strengthening   UBE (Upper Arm Bike) L3 73fd/3rev   Cybex Row --   Other Machines  for Strengthening Lat pull downs 25lb x 15      Shoulder Exercises: Supine   Horizontal ABduction AROM;Strengthening;Both;15 reps;Theraband   Theraband Level (Shoulder Horizontal ABduction) Level 2 (Red)   Horizontal ABduction Limitations laying on 6" bolster    External Rotation AROM;15 reps   Theraband Level (Shoulder External Rotation) Level 2 (Red)   External Rotation Limitations laying on 6" bolster   Other Supine Exercises Alternating shoulder flexion/extension with red TB laying on 6" bolster x 10 reps each way     Shoulder Exercises: Standing   Horizontal ABduction AROM;Strengthening;Both;15 reps;Theraband  5" scap. squeeze; 2 sets    Theraband Level (Shoulder Horizontal ABduction) Level 1 (Yellow)   Horizontal ABduction Limitations leaning on 6" bolster on wall    External Rotation AROM;Strengthening;15 reps;Theraband   Theraband Level (Shoulder External Rotation) Level 2 (Red)   External Rotation Limitations Leaning on 6" bolster on wall    Extension AROM;Strengthening;Both;15 reps;Theraband  3" hold    Theraband Level (Shoulder Extension) Level 2 (Red)   Extension Limitations       Moist Heat Therapy   Number Minutes Moist Heat 15 Minutes   Moist Heat Location Cervical  mid back      Electrical Stimulation   Electrical Stimulation Location cerv  Electrical Stimulation Action IFC   Electrical Stimulation Parameters pt. tolerance    Electrical Stimulation Goals Pain     Neck Exercises: Stretches   Upper Trapezius Stretch 30 seconds;2 reps   Levator Stretch 30 seconds;2 reps                  PT Short Term Goals - 08/08/16 1443      PT SHORT TERM GOAL #1   Title independent with initial HEP   Time 2   Period Weeks   Status New           PT Long Term Goals - 09/25/16 1428      PT LONG TERM GOAL #1   Title increase lumbar ROM 25%   Status Achieved     PT LONG TERM GOAL #2   Title increase cervical ROM 25%   Status Partially Met     PT  LONG TERM GOAL #3   Title decrease pain 50%   Status Partially Met     PT LONG TERM GOAL #4   Title lift child without any difficulty   Status Achieved               Plan - 10/18/16 1552    Clinical Impression Statement Pt. noting some improvement in neck pain since starting muscle relaxer.  Noting, "I felt discouraged last treatment about my neck but feel better today about it".  Neck pain only complaint today thus cervical stretching and postural re-education focus of today's visit.  Pt. tolerated all scapular strengthening well and ending treatment with low level neck pain.  E-stim/moist heat combo to cervical spine to decrease tone and pain.   PT Treatment/Interventions ADLs/Self Care Home Management;Cryotherapy;Electrical Stimulation;Moist Heat;Traction;Ultrasound;Functional mobility training;Therapeutic activities;Therapeutic exercise;Patient/family education;Manual techniques   PT Next Visit Plan assess and progress as tolerated      Patient will benefit from skilled therapeutic intervention in order to improve the following deficits and impairments:  Decreased activity tolerance, Decreased range of motion, Decreased strength, Increased fascial restricitons, Increased muscle spasms, Impaired flexibility, Postural dysfunction, Improper body mechanics, Pain  Visit Diagnosis: Acute bilateral low back pain without sciatica  Cervicalgia  Muscle spasm of back     Problem List Patient Active Problem List   Diagnosis Date Noted  . Postpartum care following cesarean delivery (11/4) 04/23/2015   Bess Harvest, PTA 10/18/16 4:15 PM   [place], [place], [address] Phone: [phone]   Fax:  [phone]  Name: Yevette Knust MRN: [id number] Date of Birth: 1976/01/02

## 2016-10-25 ENCOUNTER — Ambulatory Visit: Payer: BLUE CROSS/BLUE SHIELD | Admitting: Physical Therapy

## 2016-11-03 DIAGNOSIS — M62838 Other muscle spasm: Secondary | ICD-10-CM | POA: Diagnosis not present

## 2016-11-09 NOTE — Addendum Note (Signed)
Addended by: Jearld LeschALBRIGHT, Bryon Parker W on: 11/09/2016 09:51 AM   Modules accepted: Orders

## 2016-12-01 ENCOUNTER — Ambulatory Visit: Payer: BLUE CROSS/BLUE SHIELD | Attending: Family Medicine | Admitting: Physical Therapy

## 2016-12-01 DIAGNOSIS — M62830 Muscle spasm of back: Secondary | ICD-10-CM | POA: Diagnosis not present

## 2016-12-01 DIAGNOSIS — M545 Low back pain, unspecified: Secondary | ICD-10-CM

## 2016-12-01 DIAGNOSIS — M542 Cervicalgia: Secondary | ICD-10-CM | POA: Insufficient documentation

## 2016-12-01 DIAGNOSIS — M6281 Muscle weakness (generalized): Secondary | ICD-10-CM | POA: Insufficient documentation

## 2016-12-01 NOTE — Patient Instructions (Signed)
   Isometric Rotation   Put left index finger on left temple. Gently try to turn head to right, pushing against finger. Hold __5__ seconds. Repeat on the left. Push and release slowly. Repeat __5__ times. Do __2-3__ sessions per day.  http://gt2.exer.us/24   Copyright  VHI. All rights reserved.  Isometric Lateral Flexion   Put right index finger on right temple. Gently try to move right ear toward shoulder, pushing against finger. Hold __5_ seconds. Repeat on other side. Push and release slowly. Repeat _5___ times. Do __2-3__ sessions per day.  http://[email or web address].exer.us/22   Copyright  VHI. All rights reserved.  Isometric Flexion   Put the tips of both index fingers lightly on forehead. Gently press into fingers as if looking toward ground. Resist for __5__ seconds. Press and release slowly. Repeat __5__ times. Do _2-3___ sessions per day.  http://gt2.exer.us/18   Copyright  VHI. All rights reserved.  Isometric Extension   Put index fingers gently on back of head. Slowly try to look toward ceiling. Push head into fingers for ___5_ seconds. Push and release slowly. Repeat __5__ times. Do __2-3__ sessions per day.  http://gt2.exer.us/20   Copyright  VHI. All rights reserved.    Lavinia SharpsStacy Jhovani Griswold PT [facility] [address], [address] [place], [place] [address] Phone # [phone] Fax [phone]
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
## 2016-12-04 ENCOUNTER — Ambulatory Visit: Payer: BLUE CROSS/BLUE SHIELD | Admitting: Physical Therapy

## 2016-12-04 DIAGNOSIS — M545 Low back pain, unspecified: Secondary | ICD-10-CM

## 2016-12-04 DIAGNOSIS — M6281 Muscle weakness (generalized): Secondary | ICD-10-CM

## 2016-12-04 DIAGNOSIS — M62830 Muscle spasm of back: Secondary | ICD-10-CM | POA: Diagnosis not present

## 2016-12-04 DIAGNOSIS — M542 Cervicalgia: Secondary | ICD-10-CM

## 2016-12-04 NOTE — Therapy (Signed)
[facility] [address], [address] [place], [place], [address] Phone: [phone]   Fax:  [phone]  Physical Therapy Treatment  Patient Details  Name: Darrell Jewelamara Sabo-Thayer MRN: [id number] Date of Birth: 07/25/1975 Referring Provider: Louis MeckelJ Mauney  Encounter Date: 12/04/2016      PT End of Session - 12/04/16 1451    Visit Number 13   Date for PT Re-Evaluation 01/26/17   PT Start Time 1400   PT Stop Time 1500   PT Time Calculation (min) 60 min   Activity Tolerance Patient tolerated treatment well   Behavior During Therapy [facility] for tasks assessed/performed      Past Medical History:  Diagnosis Date  . Asthma    rare inhaler use  . Complication of anesthesia    problems with epidural- 3 attempts  . Postpartum care following cesarean delivery (11/4) 04/23/2015    Past Surgical History:  Procedure Laterality Date  . CESAREAN SECTION     x 2  . CESAREAN SECTION WITH BILATERAL TUBAL LIGATION Bilateral 04/23/2015   Procedure: CESAREAN SECTION WITH BILATERAL TUBAL LIGATION;  Surgeon: Maxie BetterSheronette Cousins, MD;  Location: [facility];  Service: Obstetrics;  Laterality: Bilateral;  EDDL: 05/13/15 Allergy: Coconut, Tree Nuts, Prednisone  . DIAGNOSTIC LAPAROSCOPY     2002    There were no vitals filed for this visit.      Subjective Assessment - 12/04/16 1405    Subjective Still having pain and wants to try breastfeeding.    Pertinent History breastfeeding 1119 month old so can't take a lot of meds   Limitations Reading;Sitting   Patient Stated Goals have less pain   Currently in Pain? Yes   Pain Score 1    Pain Location Neck   Pain Orientation Right   Pain Descriptors / Indicators Aching;Shooting   Pain Type Acute pain   Pain Radiating Towards rotation at neck   Pain Onset 1 to 4 weeks ago   Pain Frequency Constant   Aggravating Factors  turning head, looking up; positioning for breastfeeding; lifting child sometimes; driving   Pain Relieving Factors manual therapy; heat temporarily   Effect of Pain on Daily Activities limits all ADL's   Multiple Pain Sites No                         [facility] Adult PT Treatment/Exercise - 12/04/16 0001      Modalities   Modalities Electrical Stimulation;Moist Heat     Moist Heat Therapy   Number Minutes Moist Heat 20 Minutes   Moist Heat Location Cervical  supine     Electrical Stimulation   Electrical Stimulation Location cerv   Electrical Stimulation Action IFC   Electrical Stimulation Parameters 20 min, to patient tolerance   Electrical Stimulation Goals Pain     Manual Therapy   Manual Therapy Soft tissue mobilization   Soft tissue mobilization subocciptials; paraspinals in cervical, upper trap, SCM, scalenes, suobocciptals          Trigger Point Dry Needling - 12/04/16 1408    Consent Given? Yes   Education Handout Provided Yes   Muscles Treated Upper Body Suboccipitals muscle group;Upper trapezius  multifidi   Sternocleidomastoid Response Twitch response elicited;Palpable increased muscle length   Upper Trapezius Response Twitch reponse elicited;Palpable increased muscle length   Oblique Capitus Response Twitch response elicited;Palpable increased muscle length   SubOccipitals Response Twitch response elicited;Palpable increased muscle length  PT Education - 12/04/16 1447    Education provided Yes   Education Details dry needling info   Person(s) Educated Patient   Methods Explanation;Handout   Comprehension Verbalized understanding          PT Short Term Goals - 12/04/16 1456      PT SHORT TERM GOAL #1   Title independent with initial HEP, postural correction including sleeping positions   12/29/16   Time 4   Period Weeks   Status On-going     PT SHORT TERM GOAL #2   Title Cervical extension, rotation and sidebending ROM improved to at least 30 degrees needed for driving    Time 4   Period Weeks   Status  On-going           PT Long Term Goals - 12/01/16 1232      PT LONG TERM GOAL #1   Title Patient will be independent in safe self progression of HEP need for further improvements in postural strength   01/26/17   Time 8   Period Weeks   Status New     PT LONG TERM GOAL #2   Title Patient will report a 60% improvement in neck pain with ADLs including breastfeeding, driving, sleeping   Time 8   Period Weeks   Status New     PT LONG TERM GOAL #3   Title Cervical extension, rotation, and sidebending ROM improved to 35 degrees needed for driving   Time 8   Period Weeks   Status New     PT LONG TERM GOAL #4   Title Cervical, glenohumeral and scapular strength improved to grossly 4/5 to 4+/5 needed for lifting child   Time 8   Period Weeks   Status New               Plan - 12/04/16 1452    Clinical Impression Statement Patient was able to turn her head with greater ease. Many twitches were seen in the muscles.  Patient was getting a headache but after the soft tissue work headace was not there.  Patient will benefit from skilled therapy to address the dficits and reduce pain.    Rehab Potential Good   PT Frequency 2x / week   PT Duration 8 weeks   PT Treatment/Interventions ADLs/Self Care Home Management;Cryotherapy;Electrical Stimulation;Moist Heat;Traction;Ultrasound;Functional mobility training;Therapeutic activities;Therapeutic exercise;Patient/family education;Manual techniques;Taping;Dry needling   PT Next Visit Plan Assess dry needling; soft tissue work; lay head on ball and do the rotation, head press,    PT Home Exercise Plan progress as needed   Consulted and Agree with Plan of Care Patient      Patient will benefit from skilled therapeutic intervention in order to improve the following deficits and impairments:  Decreased activity tolerance, Decreased range of motion, Decreased strength, Increased fascial restricitons, Increased muscle spasms, Impaired  flexibility, Postural dysfunction, Improper body mechanics, Pain, Impaired UE functional use  Visit Diagnosis: Cervicalgia  Acute bilateral low back pain without sciatica  Muscle weakness (generalized)  Muscle spasm of back     Problem List Patient Active Problem List   Diagnosis Date Noted  . Postpartum care following cesarean delivery (11/4) 04/23/2015    Eulis Foster, PT 12/04/16 2:57 PM   [facility] [address], [address] [place], [place], [address] Phone: [phone]   Fax:  [phone]  Name: Goldye Tourangeau MRN: [id number] Date of Birth: 07-16-1975

## 2016-12-04 NOTE — Patient Instructions (Signed)

## 2016-12-07 ENCOUNTER — Encounter: Payer: BLUE CROSS/BLUE SHIELD | Admitting: Physical Therapy

## 2016-12-08 ENCOUNTER — Ambulatory Visit: Payer: BLUE CROSS/BLUE SHIELD | Admitting: Physical Therapy

## 2016-12-08 ENCOUNTER — Encounter: Payer: Self-pay | Admitting: Physical Therapy

## 2016-12-08 DIAGNOSIS — M6281 Muscle weakness (generalized): Secondary | ICD-10-CM | POA: Diagnosis not present

## 2016-12-08 DIAGNOSIS — M542 Cervicalgia: Secondary | ICD-10-CM

## 2016-12-08 DIAGNOSIS — M62830 Muscle spasm of back: Secondary | ICD-10-CM | POA: Diagnosis not present

## 2016-12-08 DIAGNOSIS — M545 Low back pain, unspecified: Secondary | ICD-10-CM

## 2016-12-08 NOTE — Patient Instructions (Signed)
   Open to comfortable range then bring hands back together, repeat 10 x       Pec Stretch on Foam Roller   Laying on a foam roller, keep your knees bent and arms out to the side. The stretch can be changed by moving arms at different angles   Hold for 1-2 minutes    Pec Corner Stretch  Upper arms should be parallel to ground.  Don't hyperextend low back.  Shoulder blades should be down back and together.  Lean forward to feel a stretch in the pecs.  (Usually the only open corner at home is in the bathroom behind the door.)  Hold 30 sec repeat 3 x

## 2016-12-08 NOTE — Therapy (Signed)
[facility] [address], [address] [place], [place], [address] Phone: [phone]   Fax:  [phone]  Physical Therapy Treatment  Patient Details  Name: Mindy Stewart MRN: [id number] Date of Birth: 08/26/1975 Referring Provider: Louis MeckelJ Mauney  Encounter Date: 12/08/2016      PT End of Session - 12/08/16 0932    Visit Number 14   Date for PT Re-Evaluation 01/26/17   PT Start Time 0932   PT Stop Time 1012   PT Time Calculation (min) 40 min   Activity Tolerance Patient tolerated treatment well   Behavior During Therapy [facility] for tasks assessed/performed      Past Medical History:  Diagnosis Date  . Asthma    rare inhaler use  . Complication of anesthesia    problems with epidural- 3 attempts  . Postpartum care following cesarean delivery (11/4) 04/23/2015    Past Surgical History:  Procedure Laterality Date  . CESAREAN SECTION     x 2  . CESAREAN SECTION WITH BILATERAL TUBAL LIGATION Bilateral 04/23/2015   Procedure: CESAREAN SECTION WITH BILATERAL TUBAL LIGATION;  Surgeon: Maxie BetterSheronette Cousins, MD;  Location: [facility];  Service: Obstetrics;  Laterality: Bilateral;  EDDL: 05/13/15 Allergy: Coconut, Tree Nuts, Prednisone  . DIAGNOSTIC LAPAROSCOPY     2002    There were no vitals filed for this visit.      Subjective Assessment - 12/08/16 0934    Subjective I felt really good on Tuesday, but then everything really stiffened back up a lot on Wednesday.  Today it's pretty sore.   Pertinent History breastfeeding 10219 month old so can't take a lot of meds   Limitations Reading;Sitting   Patient Stated Goals have less pain   Currently in Pain? Yes   Pain Score 2    Pain Location Neck   Pain Orientation Right   Pain Descriptors / Indicators Tightness;Aching   Pain Type Acute pain   Pain Onset 1 to 4 weeks ago   Pain Frequency Constant   Aggravating Factors  turning, looking up and down, woke up very sore on Wednesday    Pain Relieving Factors muscle relaxers, heat   Effect of Pain on Daily Activities limits   Multiple Pain Sites No                         [facility] Adult PT Treatment/Exercise - 12/08/16 0001      Shoulder Exercises: Stretch   Corner Stretch 3 reps;20 seconds   Other Shoulder Stretches upper trap stretch, open book stretch, pec stretch in supine - 3x 30 sec     Manual Therapy   Manual Therapy Soft tissue mobilization   Soft tissue mobilization subocciptials; paraspinals in cervical, upper trap, SCM, scalenes, suobocciptals                PT Education - 12/08/16 1012    Education provided Yes   Education Details thoracic rotation and pec stretch   Person(s) Educated Patient   Methods Explanation;Demonstration;Handout   Comprehension Verbalized understanding;Returned demonstration          PT Short Term Goals - 12/04/16 1456      PT SHORT TERM GOAL #1   Title independent with initial HEP, postural correction including sleeping positions   12/29/16   Time 4   Period Weeks   Status On-going     PT SHORT TERM GOAL #2   Title Cervical extension, rotation and sidebending ROM improved to  at least 30 degrees needed for driving    Time 4   Period Weeks   Status On-going           PT Long Term Goals - 12/01/16 1232      PT LONG TERM GOAL #1   Title Patient will be independent in safe self progression of HEP need for further improvements in postural strength   01/26/17   Time 8   Period Weeks   Status New     PT LONG TERM GOAL #2   Title Patient will report a 60% improvement in neck pain with ADLs including breastfeeding, driving, sleeping   Time 8   Period Weeks   Status New     PT LONG TERM GOAL #3   Title Cervical extension, rotation, and sidebending ROM improved to 35 degrees needed for driving   Time 8   Period Weeks   Status New     PT LONG TERM GOAL #4   Title Cervical, glenohumeral and scapular strength improved to grossly 4/5 to 4+/5  needed for lifting child   Time 8   Period Weeks   Status New               Plan - 12/08/16 1013    Clinical Impression Statement Patient felt release after manual treatment and was able to more easily bring head up and down.  She has visible muscle tightness of right SCM and shoulder girldle is rotated to the right.  Pt has pain with thoracic rotation to the left and was educated to perform only in a comfortable range of motion.  Pt will benefit from skilled PT to address impairments for improved funciton.   PT Treatment/Interventions ADLs/Self Care Home Management;Cryotherapy;Electrical Stimulation;Moist Heat;Traction;Ultrasound;Functional mobility training;Therapeutic activities;Therapeutic exercise;Patient/family education;Manual techniques;Taping;Dry needling   PT Next Visit Plan dry needling/soft tissue as needed, thoracic and cervical ROM and gentle postural strengthening   PT Home Exercise Plan progress as needed   Consulted and Agree with Plan of Care Patient      Patient will benefit from skilled therapeutic intervention in order to improve the following deficits and impairments:  Decreased activity tolerance, Decreased range of motion, Decreased strength, Increased fascial restricitons, Increased muscle spasms, Impaired flexibility, Postural dysfunction, Improper body mechanics, Pain, Impaired UE functional use  Visit Diagnosis: Cervicalgia  Acute bilateral low back pain without sciatica  Muscle weakness (generalized)  Muscle spasm of back     Problem List Patient Active Problem List   Diagnosis Date Noted  . Postpartum care following cesarean delivery (11/4) 04/23/2015    Vincente Poli, PT 12/08/2016, 12:09 PM  [facility] [address] [place], [place], [address] Phone: [phone]   Fax:  [phone]  Name: Mindy Stewart MRN: [id number] Date of Birth: Apr 26, 1976

## 2016-12-11 ENCOUNTER — Encounter: Payer: Self-pay | Admitting: Physical Therapy

## 2016-12-11 ENCOUNTER — Ambulatory Visit: Payer: BLUE CROSS/BLUE SHIELD | Admitting: Physical Therapy

## 2016-12-11 DIAGNOSIS — M545 Low back pain, unspecified: Secondary | ICD-10-CM

## 2016-12-11 DIAGNOSIS — M542 Cervicalgia: Secondary | ICD-10-CM

## 2016-12-11 DIAGNOSIS — M62830 Muscle spasm of back: Secondary | ICD-10-CM

## 2016-12-11 DIAGNOSIS — M6281 Muscle weakness (generalized): Secondary | ICD-10-CM

## 2016-12-11 NOTE — Therapy (Signed)
[facility] [address] [place], [place], [address] Phone: [phone]   Fax:  [phone]  Physical Therapy Treatment  Patient Details  Name: Calin Ellery MRN: [id number] Date of Birth: [birth year] Referring Provider: Louis Meckel  Encounter Date: 12/11/2016      PT End of Session - 12/11/16 1534    Visit Number 15   Date for PT Re-Evaluation 01/26/17   PT Start Time 1527   PT Stop Time 1622   PT Time Calculation (min) 55 min   Activity Tolerance Patient tolerated treatment well      Past Medical History:  Diagnosis Date  . Asthma    rare inhaler use  . Complication of anesthesia    problems with epidural- 3 attempts  . Postpartum care following cesarean delivery (11/4) 04/23/2015    Past Surgical History:  Procedure Laterality Date  . CESAREAN SECTION     x 2  . CESAREAN SECTION WITH BILATERAL TUBAL LIGATION Bilateral 04/23/2015   Procedure: CESAREAN SECTION WITH BILATERAL TUBAL LIGATION;  Surgeon: Maxie Better, MD;  Location: [facility];  Service: Obstetrics;  Laterality: Bilateral;  EDDL: 05/13/15 Allergy: Coconut, Tree Nuts, Prednisone  . DIAGNOSTIC LAPAROSCOPY     2002    There were no vitals filed for this visit.      Subjective Assessment - 12/11/16 1534    Subjective I have a HA today and my right neck is tender.   Pertinent History goes by "[name]", breastfeeding 59 month old so can't take a lot of meds   Limitations Reading;Sitting   Currently in Pain? Yes   Pain Score 2    Pain Location Neck   Pain Orientation Right   Pain Descriptors / Indicators Aching   Multiple Pain Sites No                         [facility] Adult PT Treatment/Exercise - 12/11/16 0001      Self-Care   Self-Care Posture   Posture Posture with sleeping and in the car     Neck Exercises: Seated   Other Seated Exercise Cervical trigger point release  with thera cane      Neck Exercises: Supine    Other Supine Exercise MELT Method cervical release with soft foam roll  for home release      Neck Exercises: Sidelying   Other Sidelying Exercise Thoracic rotation LT with VC to turn cervical spine as best as she can, 10x     Lumbar Exercises: Supine   Ab Set --  TA contraction in supine 10x     Shoulder Exercises: Pulleys   Flexion 2 minutes                PT Education - 12/11/16 1537    Education provided Yes   Education Details where to purchase foam roll and cervical release protocol   Person(s) Educated Patient   Methods Explanation;Demonstration   Comprehension Verbalized understanding;Returned demonstration          PT Short Term Goals - 12/04/16 1456      PT SHORT TERM GOAL #1   Title independent with initial HEP, postural correction including sleeping positions   12/29/16   Time 4   Period Weeks   Status On-going     PT SHORT TERM GOAL #2   Title Cervical extension, rotation and sidebending ROM improved to at least 30 degrees needed for driving    Time 4  Period Weeks   Status On-going           PT Long Term Goals - 12/01/16 1232      PT LONG TERM GOAL #1   Title Patient will be independent in safe self progression of HEP need for further improvements in postural strength   01/26/17   Time 8   Period Weeks   Status New     PT LONG TERM GOAL #2   Title Patient will report a 60% improvement in neck pain with ADLs including breastfeeding, driving, sleeping   Time 8   Period Weeks   Status New     PT LONG TERM GOAL #3   Title Cervical extension, rotation, and sidebending ROM improved to 35 degrees needed for driving   Time 8   Period Weeks   Status New     PT LONG TERM GOAL #4   Title Cervical, glenohumeral and scapular strength improved to grossly 4/5 to 4+/5 needed for lifting child   Time 8   Period Weeks   Status New               Plan - 12/11/16 1533    Clinical Impression Statement Pt presents with sore right  cervical soft tissue and a HA. Pt continues to compensate for core weakness via her cervical muscles. Very difficult for pt to sense where her body is in space.  Limited cervical rotation with thoracic rotation to the LT   Rehab Potential Good   PT Frequency 2x / week   PT Duration 8 weeks   PT Treatment/Interventions ADLs/Self Care Home Management;Cryotherapy;Electrical Stimulation;Moist Heat;Traction;Ultrasound;Functional mobility training;Therapeutic activities;Therapeutic exercise;Patient/family education;Manual techniques;Taping;Dry needling   PT Next Visit Plan DN #2 is next   Consulted and Agree with Plan of Care --      Patient will benefit from skilled therapeutic intervention in order to improve the following deficits and impairments:  Decreased activity tolerance, Decreased range of motion, Decreased strength, Increased fascial restricitons, Increased muscle spasms, Impaired flexibility, Postural dysfunction, Improper body mechanics, Pain, Impaired UE functional use  Visit Diagnosis: Cervicalgia  Acute bilateral low back pain without sciatica  Muscle weakness (generalized)  Muscle spasm of back     Problem List Patient Active Problem List   Diagnosis Date Noted  . Postpartum care following cesarean delivery (11/4) 04/23/2015    Jorgia Manthei, PTA 12/11/2016, 4:25 PM   [facility] [address], [address] [place], [place], [address] Phone: [phone]   Fax:  [phone]  Name: Darrell Jewelamara Sabo-Thayer MRN: [id number] Date of Birth: 12/19/1975

## 2016-12-14 ENCOUNTER — Ambulatory Visit: Payer: BLUE CROSS/BLUE SHIELD

## 2016-12-14 DIAGNOSIS — M545 Low back pain, unspecified: Secondary | ICD-10-CM

## 2016-12-14 DIAGNOSIS — M542 Cervicalgia: Secondary | ICD-10-CM | POA: Diagnosis not present

## 2016-12-14 DIAGNOSIS — M62830 Muscle spasm of back: Secondary | ICD-10-CM | POA: Diagnosis not present

## 2016-12-14 DIAGNOSIS — M6281 Muscle weakness (generalized): Secondary | ICD-10-CM

## 2016-12-14 NOTE — Therapy (Signed)
[facility] [address], [address] [place], [place], [address] Phone: [phone]   Fax:  [phone]  Physical Therapy Treatment  Patient Details  Name: Mindy Jewelamara Sabo-Thayer MRN: [id number] Date of Birth: 01/31/1976 Referring Provider: Louis MeckelJ Mauney  Encounter Date: 12/14/2016      PT End of Session - 12/14/16 1102    Visit Number 16   Date for PT Re-Evaluation 01/26/17   PT Start Time 1016   PT Stop Time 1117  dry needling   PT Time Calculation (min) 61 min   Behavior During Therapy [facility] for tasks assessed/performed      Past Medical History:  Diagnosis Date  . Asthma    rare inhaler use  . Complication of anesthesia    problems with epidural- 3 attempts  . Postpartum care following cesarean delivery (11/4) 04/23/2015    Past Surgical History:  Procedure Laterality Date  . CESAREAN SECTION     x 2  . CESAREAN SECTION WITH BILATERAL TUBAL LIGATION Bilateral 04/23/2015   Procedure: CESAREAN SECTION WITH BILATERAL TUBAL LIGATION;  Surgeon: Maxie BetterSheronette Cousins, MD;  Location: [facility];  Service: Obstetrics;  Laterality: Bilateral;  EDDL: 05/13/15 Allergy: Coconut, Tree Nuts, Prednisone  . DIAGNOSTIC LAPAROSCOPY     2002    There were no vitals filed for this visit.      Subjective Assessment - 12/14/16 1019    Subjective Ready to try needling.  Relief after dry needling today.     Pertinent History goes by "Mindy Stewart", breastfeeding 5819 month old so can't take a lot of meds   Currently in Pain? Yes   Pain Score 2    Pain Location Neck   Pain Orientation Right                         [facility] Adult PT Treatment/Exercise - 12/14/16 0001      Moist Heat Therapy   Number Minutes Moist Heat 15 Minutes   Moist Heat Location Cervical     Electrical Stimulation   Electrical Stimulation Location bil neck   Electrical Stimulation Action IFC   Electrical Stimulation Parameters 15 minutes   Electrical Stimulation  Goals Pain     Manual Therapy   Manual Therapy Soft tissue mobilization   Soft tissue mobilization subocciptials; paraspinals in cervical, upper trap, SCM, scalenes, suobocciptals          Trigger Point Dry Needling - 12/14/16 1053    Consent Given? Yes   Muscles Treated Upper Body Suboccipitals muscle group;Oblique capitus;Upper trapezius   Upper Trapezius Response Twitch reponse elicited;Palpable increased muscle length   Oblique Capitus Response Twitch response elicited;Palpable increased muscle length   SubOccipitals Response Twitch response elicited;Palpable increased muscle length                PT Short Term Goals - 12/14/16 1053      PT SHORT TERM GOAL #1   Title independent with initial HEP, postural correction including sleeping positions   12/29/16   Status Achieved     PT SHORT TERM GOAL #2   Title Cervical extension, rotation and sidebending ROM improved to at least 30 degrees needed for driving    Period Weeks   Status On-going           PT Long Term Goals - 12/01/16 1232      PT LONG TERM GOAL #1   Title Patient will be independent in safe self progression of HEP need  for further improvements in postural strength   01/26/17   Time 8   Period Weeks   Status New     PT LONG TERM GOAL #2   Title Patient will report a 60% improvement in neck pain with ADLs including breastfeeding, driving, sleeping   Time 8   Period Weeks   Status New     PT LONG TERM GOAL #3   Title Cervical extension, rotation, and sidebending ROM improved to 35 degrees needed for driving   Time 8   Period Weeks   Status New     PT LONG TERM GOAL #4   Title Cervical, glenohumeral and scapular strength improved to grossly 4/5 to 4+/5 needed for lifting child   Time 8   Period Weeks   Status New               Plan - 12/14/16 1100    Clinical Impression Statement Pt with trigger points in bil upper traps and cervical paraspinals.  Pt is independent in HEP for  flexibility and strength.  Pt with improved tissue mobility and reduced pain after dry needling today.  Pt will continue to benefit from skilled PT for strength, flexibility and dry needling/manual as needed.     PT Treatment/Interventions ADLs/Self Care Home Management;Cryotherapy;Electrical Stimulation;Moist Heat;Traction;Ultrasound;Functional mobility training;Therapeutic activities;Therapeutic exercise;Patient/family education;Manual techniques;Taping;Dry needling   PT Next Visit Plan assess response to DN #3, continue flexibility, manual and postural strength   Consulted and Agree with Plan of Care Patient      Patient will benefit from skilled therapeutic intervention in order to improve the following deficits and impairments:     Visit Diagnosis: Cervicalgia  Acute bilateral low back pain without sciatica  Muscle weakness (generalized)  Muscle spasm of back     Problem List Patient Active Problem List   Diagnosis Date Noted  . Postpartum care following cesarean delivery (11/4) 04/23/2015     Lorrene Reid, PT 12/14/16 11:07 AM  [facility] [address], [address] [place], [place], [address] Phone: [phone]   Fax:  [phone]  Name: Mindy Stewart MRN: [id number] Date of Birth: 18-Jun-1976

## 2016-12-18 ENCOUNTER — Ambulatory Visit: Payer: BLUE CROSS/BLUE SHIELD | Attending: Family Medicine | Admitting: Physical Therapy

## 2016-12-18 ENCOUNTER — Encounter: Payer: Self-pay | Admitting: Physical Therapy

## 2016-12-18 DIAGNOSIS — M6281 Muscle weakness (generalized): Secondary | ICD-10-CM | POA: Diagnosis not present

## 2016-12-18 DIAGNOSIS — M62830 Muscle spasm of back: Secondary | ICD-10-CM | POA: Diagnosis not present

## 2016-12-18 DIAGNOSIS — M545 Low back pain, unspecified: Secondary | ICD-10-CM

## 2016-12-18 DIAGNOSIS — M542 Cervicalgia: Secondary | ICD-10-CM

## 2016-12-18 NOTE — Therapy (Signed)
[facility] [address] [place], [place], [address] Phone: [phone]   Fax:  [phone]  Physical Therapy Treatment  Patient Details  Name: Mindy Stewart MRN: [id number] Date of Birth: [birth year] Referring Provider: Amaryllis Dyke  Encounter Date: 12/18/2016      PT End of Session - 12/18/16 0756    Visit Number 17   Date for PT Re-Evaluation 01/26/17   PT Start Time 0755   PT Stop Time 0850   PT Time Calculation (min) 55 min   Activity Tolerance Patient tolerated treatment well   Behavior During Therapy [facility] for tasks assessed/performed      Past Medical History:  Diagnosis Date  . Asthma    rare inhaler use  . Complication of anesthesia    problems with epidural- 3 attempts  . Postpartum care following cesarean delivery (11/4) 04/23/2015    Past Surgical History:  Procedure Laterality Date  . CESAREAN SECTION     x 2  . CESAREAN SECTION WITH BILATERAL TUBAL LIGATION Bilateral 04/23/2015   Procedure: CESAREAN SECTION WITH BILATERAL TUBAL LIGATION;  Surgeon: Servando Salina, MD;  Location: [facility];  Service: Obstetrics;  Laterality: Bilateral;  EDDL: 05/13/15 Allergy: Coconut, Tree Nuts, Prednisone  . DIAGNOSTIC LAPAROSCOPY     2002    There were no vitals filed for this visit.      Subjective Assessment - 12/18/16 0757    Subjective The dry needling really helped! I turned my head rest in the car and that also helped alo t when I drive.    Pertinent History goes by "[name]", breastfeeding 59 month old so can't take a lot of meds   Limitations Reading;Sitting   Patient Stated Goals have less pain   Currently in Pain? Yes   Pain Score 1   With certain movements   Pain Location Neck   Pain Orientation Right   Pain Descriptors / Indicators Sore   Aggravating Factors  turning   Pain Relieving Factors dry needling   Multiple Pain Sites No            [facility] PT Assessment - 12/18/16 0001      AROM   Overall AROM Comments Active rotation RT 40 degrees, LT 38 degrees                     [facility] Adult PT Treatment/Exercise - 12/18/16 0001      Neck Exercises: Machines for Strengthening   UBE (Upper Arm Bike) L1 x 5 min forward with pillow for lumbar support     Neck Exercises: Seated   Cervical Rotation --  Towel used for mobilization 10x Bil     Neck Exercises: Supine   Other Supine Exercise MELT Method cervical release with soft foam roll  for home release      Shoulder Exercises: Supine   Horizontal ABduction --  yellow band horizontal abd 10x     Shoulder Exercises: Pulleys   Flexion 3 minutes     Moist Heat Therapy   Number Minutes Moist Heat 15 Minutes   Moist Heat Location Cervical     Manual Therapy   Manual Therapy Soft tissue mobilization   Soft tissue mobilization subocciptials; paraspinals in cervical, upper trap, SCM, scalenes, suobocciptals  pt requested very light touch/pressure                  PT Short Term Goals - 12/18/16 0813      PT SHORT  TERM GOAL #1   Title independent with initial HEP, postural correction including sleeping positions   12/29/16   Time 4   Period Weeks   Status Achieved     PT SHORT TERM GOAL #2   Title Cervical extension, rotation and sidebending ROM improved to at least 30 degrees needed for driving    Period Weeks   Status Achieved           PT Long Term Goals - 12/01/16 1232      PT LONG TERM GOAL #1   Title Patient will be independent in safe self progression of HEP need for further improvements in postural strength   01/26/17   Time 8   Period Weeks   Status New     PT LONG TERM GOAL #2   Title Patient will report a 60% improvement in neck pain with ADLs including breastfeeding, driving, sleeping   Time 8   Period Weeks   Status New     PT LONG TERM GOAL #3   Title Cervical extension, rotation, and sidebending ROM improved to 35 degrees needed for driving   Time 8   Period  Weeks   Status New     PT LONG TERM GOAL #4   Title Cervical, glenohumeral and scapular strength improved to grossly 4/5 to 4+/5 needed for lifting child   Time 8   Period Weeks   Status New               Plan - 12/18/16 0805    Clinical Impression Statement Pt found great pain relief with the dry needling last session. She presented with very low pain but very limited cervical AROM.  Pt has met all short term goals as of today. Pt is unsure if she likes the foam roll release work as the pressure is pretty firm for her. She tolerated very light pressure during soft tissue work.    Rehab Potential Good   PT Frequency 2x / week   PT Duration 8 weeks   PT Next Visit Plan DN #4, ROM and gradual muscular strength and endurance   PT Home Exercise Plan progress as needed   Consulted and Agree with Plan of Care Patient      Patient will benefit from skilled therapeutic intervention in order to improve the following deficits and impairments:  Decreased activity tolerance, Decreased range of motion, Decreased strength, Increased fascial restricitons, Increased muscle spasms, Impaired flexibility, Postural dysfunction, Improper body mechanics, Pain, Impaired UE functional use  Visit Diagnosis: Cervicalgia  Acute bilateral low back pain without sciatica  Muscle weakness (generalized)  Muscle spasm of back     Problem List Patient Active Problem List   Diagnosis Date Noted  . Postpartum care following cesarean delivery (11/4) 04/23/2015    COCHRAN,JENNIFER , PTA 12/18/2016, 8:35 AM  [facility] [address], [place], [place], [address] Phone: [phone]   Fax:  [phone]  Name: Mindy Stewart MRN: [id number] Date of Birth: [birth year]

## 2016-12-21 ENCOUNTER — Ambulatory Visit: Payer: BLUE CROSS/BLUE SHIELD

## 2016-12-21 DIAGNOSIS — M545 Low back pain, unspecified: Secondary | ICD-10-CM

## 2016-12-21 DIAGNOSIS — M542 Cervicalgia: Secondary | ICD-10-CM | POA: Diagnosis not present

## 2016-12-21 DIAGNOSIS — M62830 Muscle spasm of back: Secondary | ICD-10-CM

## 2016-12-21 DIAGNOSIS — M6281 Muscle weakness (generalized): Secondary | ICD-10-CM

## 2016-12-21 NOTE — Therapy (Signed)
[facility] [address] [place], [place], [address] Phone: [phone]   Fax:  [phone]  Physical Therapy Treatment  Patient Details  Name: Mindy Stewart MRN: [id number] Date of Birth: Apr 06, 1976 Referring Provider: Louis Meckel  Encounter Date: 12/21/2016      PT End of Session - 12/21/16 1230    Visit Number 18   Date for PT Re-Evaluation 01/26/17   PT Start Time 1140   PT Stop Time 1244   PT Time Calculation (min) 64 min   Activity Tolerance Patient tolerated treatment well   Behavior During Therapy [facility] for tasks assessed/performed      Past Medical History:  Diagnosis Date  . Asthma    rare inhaler use  . Complication of anesthesia    problems with epidural- 3 attempts  . Postpartum care following cesarean delivery (11/4) 04/23/2015    Past Surgical History:  Procedure Laterality Date  . CESAREAN SECTION     x 2  . CESAREAN SECTION WITH BILATERAL TUBAL LIGATION Bilateral 04/23/2015   Procedure: CESAREAN SECTION WITH BILATERAL TUBAL LIGATION;  Surgeon: Maxie Better, MD;  Location: [facility];  Service: Obstetrics;  Laterality: Bilateral;  EDDL: 05/13/15 Allergy: Coconut, Tree Nuts, Prednisone  . DIAGNOSTIC LAPAROSCOPY     2002    There were no vitals filed for this visit.      Subjective Assessment - 12/21/16 1143    Pertinent History goes by "Mindy Stewart", breastfeeding 55 month old so can't take a lot of meds   Currently in Pain? Yes   Pain Score 1    Pain Location Neck   Pain Orientation Right   Pain Descriptors / Indicators Sore   Pain Type Acute pain   Pain Onset 1 to 4 weeks ago   Pain Frequency Constant   Aggravating Factors  looking up, turning head   Pain Relieving Factors dry needling, stretching                         [facility] Adult PT Treatment/Exercise - 12/21/16 0001      Neck Exercises: Machines for Strengthening   UBE (Upper Arm Bike) L1 x 6 (3/3)      Shoulder Exercises: Supine   Horizontal ABduction AROM;Strengthening;Both;15 reps;Theraband  yellow band horizontal abd 10x   Theraband Level (Shoulder Horizontal ABduction) Level 1 (Yellow)   External Rotation Strengthening;Both;20 reps   Theraband Level (Shoulder External Rotation) Level 1 (Yellow)     Shoulder Exercises: Pulleys   Flexion 3 minutes     Moist Heat Therapy   Number Minutes Moist Heat 15 Minutes   Moist Heat Location Cervical     Electrical Stimulation   Electrical Stimulation Location bil neck   Electrical Stimulation Action IFC   Electrical Stimulation Parameters 15 minutes   Electrical Stimulation Goals Pain     Manual Therapy   Manual Therapy Soft tissue mobilization   Soft tissue mobilization subocciptials; paraspinals in cervical, upper trap, SCM, scalenes, suobocciptals          Trigger Point Dry Needling - 12/21/16 1149    Consent Given? Yes   Muscles Treated Upper Body Suboccipitals muscle group;Oblique capitus;Upper trapezius   Upper Trapezius Response Twitch reponse elicited;Palpable increased muscle length   Oblique Capitus Response Twitch response elicited;Palpable increased muscle length   SubOccipitals Response Twitch response elicited;Palpable increased muscle length                PT Short Term  Goals - 12/18/16 0813      PT SHORT TERM GOAL #1   Title independent with initial HEP, postural correction including sleeping positions   12/29/16   Time 4   Period Weeks   Status Achieved     PT SHORT TERM GOAL #2   Title Cervical extension, rotation and sidebending ROM improved to at least 30 degrees needed for driving    Period Weeks   Status Achieved           PT Long Term Goals - 12/01/16 1232      PT LONG TERM GOAL #1   Title Patient will be independent in safe self progression of HEP need for further improvements in postural strength   01/26/17   Time 8   Period Weeks   Status New     PT LONG TERM GOAL #2   Title  Patient will report a 60% improvement in neck pain with ADLs including breastfeeding, driving, sleeping   Time 8   Period Weeks   Status New     PT LONG TERM GOAL #3   Title Cervical extension, rotation, and sidebending ROM improved to 35 degrees needed for driving   Time 8   Period Weeks   Status New     PT LONG TERM GOAL #4   Title Cervical, glenohumeral and scapular strength improved to grossly 4/5 to 4+/5 needed for lifting child   Time 8   Period Weeks   Status New               Plan - 12/21/16 1152    Clinical Impression Statement Pt reports 40% overall improvement since the start of care . Pt with difficulty looking up with cervical A/ROM into extension.  Pt with improved cervical mobility into rotation.  Pt with trigger points and tension in cervical musculature and demonstrated improved mobility after dry needling and manual therapy.  Pt will continue to benefit from skilled PT for postural strength, manual, flexibility and modalities.    Rehab Potential Good   PT Frequency 2x / week   PT Duration 8 weeks   PT Treatment/Interventions ADLs/Self Care Home Management;Cryotherapy;Electrical Stimulation;Moist Heat;Traction;Ultrasound;Functional mobility training;Therapeutic activities;Therapeutic exercise;Patient/family education;Manual techniques;Taping;Dry needling   PT Next Visit Plan  ROM and gradual muscular strength and endurance   Recommended Other Services recert is signed   Consulted and Agree with Plan of Care Patient      Patient will benefit from skilled therapeutic intervention in order to improve the following deficits and impairments:  Decreased activity tolerance, Decreased range of motion, Decreased strength, Increased fascial restricitons, Increased muscle spasms, Impaired flexibility, Postural dysfunction, Improper body mechanics, Pain, Impaired UE functional use  Visit Diagnosis: Cervicalgia  Acute bilateral low back pain without sciatica  Muscle  weakness (generalized)  Muscle spasm of back     Problem List Patient Active Problem List   Diagnosis Date Noted  . Postpartum care following cesarean delivery (11/4) 04/23/2015    Mindy Stewart, PT 12/21/16 12:34 PM  [facility] [address], [address] [place], [place], [address] Phone: [phone]   Fax:  [phone]  Name: Mindy Stewart MRN: [id number] Date of Birth: 09/12/1975

## 2016-12-25 ENCOUNTER — Ambulatory Visit: Payer: BLUE CROSS/BLUE SHIELD | Admitting: Physical Therapy

## 2016-12-25 ENCOUNTER — Encounter: Payer: Self-pay | Admitting: Physical Therapy

## 2016-12-25 DIAGNOSIS — M6281 Muscle weakness (generalized): Secondary | ICD-10-CM | POA: Diagnosis not present

## 2016-12-25 DIAGNOSIS — M545 Low back pain, unspecified: Secondary | ICD-10-CM

## 2016-12-25 DIAGNOSIS — M62830 Muscle spasm of back: Secondary | ICD-10-CM | POA: Diagnosis not present

## 2016-12-25 DIAGNOSIS — M542 Cervicalgia: Secondary | ICD-10-CM

## 2016-12-25 NOTE — Therapy (Signed)
[facility] [address] [place], [place], [address] Phone: [phone]   Fax:  [phone]  Physical Therapy Treatment  Patient Details  Name: Mindy Stewart MRN: [id number] Date of Birth: [birth year] Referring Provider: Louis Meckel  Encounter Date: 12/25/2016      PT End of Session - 12/25/16 0934    Visit Number 19   Date for PT Re-Evaluation 01/26/17   PT Start Time 0933   PT Stop Time 1031   PT Time Calculation (min) 58 min   Activity Tolerance Patient tolerated treatment well   Behavior During Therapy [facility] for tasks assessed/performed      Past Medical History:  Diagnosis Date  . Asthma    rare inhaler use  . Complication of anesthesia    problems with epidural- 3 attempts  . Postpartum care following cesarean delivery (11/4) 04/23/2015    Past Surgical History:  Procedure Laterality Date  . CESAREAN SECTION     x 2  . CESAREAN SECTION WITH BILATERAL TUBAL LIGATION Bilateral 04/23/2015   Procedure: CESAREAN SECTION WITH BILATERAL TUBAL LIGATION;  Surgeon: Maxie Better, MD;  Location: [facility];  Service: Obstetrics;  Laterality: Bilateral;  EDDL: 05/13/15 Allergy: Coconut, Tree Nuts, Prednisone  . DIAGNOSTIC LAPAROSCOPY     2002    There were no vitals filed for this visit.      Subjective Assessment - 12/25/16 0937    Subjective Yesterday was not a great day. It may have been from the needling, but pt is not sure. She took Advil and did slow stretching and today she is better. Just tight., no pain.    Pertinent History goes by "[name]", breastfeeding 13 month old so can't take a lot of meds   Limitations Reading;Sitting   Currently in Pain? No/denies  tight   Multiple Pain Sites No                         [facility] Adult PT Treatment/Exercise - 12/25/16 0001      Neck Exercises: Seated   Cervical Rotation --  Towel used for mobilization 10x Bil, VC for posture   Lateral  Flexion --  with theracane for TP Bil 3x 20-30 sec     Neck Exercises: Supine   Cervical Rotation --  10x slow rotation  bil   Other Supine Exercise yellow band horiz abd 2x10  VC to keep chin slightly down and lengthen back neck     Shoulder Exercises: Supine   External Rotation Strengthening;Both;20 reps   Theraband Level (Shoulder External Rotation) Level 1 (Yellow)   Flexion --  Supine stretches overhead 10x     Shoulder Exercises: Pulleys   Flexion 3 minutes     Moist Heat Therapy   Number Minutes Moist Heat 15 Minutes   Moist Heat Location Cervical     Electrical Stimulation   Electrical Stimulation Location bil neck   Electrical Stimulation Action IFc   Electrical Stimulation Parameters 15 min hookyling   Electrical Stimulation Goals Pain                  PT Short Term Goals - 12/18/16 0813      PT SHORT TERM GOAL #1   Title independent with initial HEP, postural correction including sleeping positions   12/29/16   Time 4   Period Weeks   Status Achieved     PT SHORT TERM GOAL #2   Title Cervical extension, rotation  and sidebending ROM improved to at least 30 degrees needed for driving    Period Weeks   Status Achieved           PT Long Term Goals - 12/25/16 0957      PT LONG TERM GOAL #1   Title Patient will be independent in safe self progression of HEP need for further improvements in postural strength   01/26/17   Time 8   Period Weeks   Status On-going     PT LONG TERM GOAL #2   Title Patient will report a 60% improvement in neck pain with ADLs including breastfeeding, driving, sleeping   Time 8   Period Weeks   Status On-going  40%               Plan - 12/25/16 0934    Clinical Impression Statement Pt is turning to her Rt significantly better at this time. She feels the needling helps even though it makes her sore.    Rehab Potential Good   PT Frequency 2x / week   PT Duration 8 weeks   PT Treatment/Interventions  ADLs/Self Care Home Management;Cryotherapy;Electrical Stimulation;Moist Heat;Traction;Ultrasound;Functional mobility training;Therapeutic activities;Therapeutic exercise;Patient/family education;Manual techniques;Taping;Dry needling   PT Next Visit Plan  ROM and gradual muscular strength and endurance   Consulted and Agree with Plan of Care Patient      Patient will benefit from skilled therapeutic intervention in order to improve the following deficits and impairments:  Decreased activity tolerance, Decreased range of motion, Decreased strength, Increased fascial restricitons, Increased muscle spasms, Impaired flexibility, Postural dysfunction, Improper body mechanics, Pain, Impaired UE functional use  Visit Diagnosis: Cervicalgia  Acute bilateral low back pain without sciatica  Muscle weakness (generalized)  Muscle spasm of back     Problem List Patient Active Problem List   Diagnosis Date Noted  . Postpartum care following cesarean delivery (11/4) 04/23/2015    Camila Maita, PTA 12/25/2016, 10:14 AM  [facility] [address] [place], [place], [address] Phone: [phone]   Fax:  [phone]  Name: Mindy Stewart MRN: [id number] Date of Birth: 08/09/1975

## 2016-12-28 ENCOUNTER — Encounter: Payer: Self-pay | Admitting: Physical Therapy

## 2016-12-28 ENCOUNTER — Ambulatory Visit: Payer: BLUE CROSS/BLUE SHIELD | Admitting: Physical Therapy

## 2016-12-28 DIAGNOSIS — M542 Cervicalgia: Secondary | ICD-10-CM | POA: Diagnosis not present

## 2016-12-28 DIAGNOSIS — M62830 Muscle spasm of back: Secondary | ICD-10-CM | POA: Diagnosis not present

## 2016-12-28 DIAGNOSIS — M545 Low back pain: Secondary | ICD-10-CM | POA: Diagnosis not present

## 2016-12-28 DIAGNOSIS — M6281 Muscle weakness (generalized): Secondary | ICD-10-CM | POA: Diagnosis not present

## 2016-12-28 NOTE — Therapy (Signed)
[facility] [address] [place], [place], [address] Phone: [phone]   Fax:  [phone]  Physical Therapy Treatment  Patient Details  Name: Mindy Stewart MRN: [id number] Date of Birth: 1976-02-07 Referring Provider: Louis Meckel  Encounter Date: 12/28/2016      PT End of Session - 12/28/16 1502    Visit Number 20   Date for PT Re-Evaluation 01/26/17   PT Start Time 1400   PT Stop Time 1505   PT Time Calculation (min) 65 min   Activity Tolerance Patient tolerated treatment well   Behavior During Therapy [facility] for tasks assessed/performed      Past Medical History:  Diagnosis Date  . Asthma    rare inhaler use  . Complication of anesthesia    problems with epidural- 3 attempts  . Postpartum care following cesarean delivery (11/4) 04/23/2015    Past Surgical History:  Procedure Laterality Date  . CESAREAN SECTION     x 2  . CESAREAN SECTION WITH BILATERAL TUBAL LIGATION Bilateral 04/23/2015   Procedure: CESAREAN SECTION WITH BILATERAL TUBAL LIGATION;  Surgeon: Maxie Better, MD;  Location: [facility];  Service: Obstetrics;  Laterality: Bilateral;  EDDL: 05/13/15 Allergy: Coconut, Tree Nuts, Prednisone  . DIAGNOSTIC LAPAROSCOPY     2002    There were no vitals filed for this visit.      Subjective Assessment - 12/28/16 1408    Subjective The soreness has improved 55%. The stiffness has only improve a little.    Pertinent History goes by "[name]", breastfeeding 14 month old so can't take a lot of meds   Limitations Reading;Sitting   Patient Stated Goals have less pain   Currently in Pain? Yes   Pain Score 1    Pain Location Neck   Pain Orientation Right;Left   Pain Descriptors / Indicators Sore   Pain Type Acute pain   Pain Radiating Towards rotation of neck   Pain Onset 1 to 4 weeks ago   Pain Frequency Intermittent   Aggravating Factors  look over either shoulder   Pain Relieving Factors dry  needling, stretching                         [facility] Adult PT Treatment/Exercise - 12/28/16 0001      Neck Exercises: Seated   Cervical Rotation --  Towel used for mobilization 10x Bil, VC for posture   Lateral Flexion --  with theracane for TP Bil 3x 20-30 sec     Neck Exercises: Supine   Cervical Rotation --  10x slow rotation  bil     Shoulder Exercises: Pulleys   Flexion 3 minutes     Modalities   Modalities Electrical Stimulation;Moist Heat     Moist Heat Therapy   Moist Heat Location Cervical     Electrical Stimulation   Electrical Stimulation Location bil neck   Electrical Stimulation Action IFC   Electrical Stimulation Parameters 15 min, to patient tolerance   Electrical Stimulation Goals Pain     Manual Therapy   Manual Therapy Soft tissue mobilization   Soft tissue mobilization subocciptials; paraspinals in cervical, upper trap, SCM, scalenes, suobocciptals          Trigger Point Dry Needling - 12/28/16 1425    Consent Given? Yes   Muscles Treated Upper Body Suboccipitals muscle group;Oblique capitus  cervical multifidi   Oblique Capitus Response Twitch response elicited;Palpable increased muscle length   SubOccipitals Response Twitch  response elicited;Palpable increased muscle length              PT Education - 12/28/16 1459    Education provided Yes   Education Details theracane; TENS unit   Person(s) Educated Patient   Methods Explanation   Comprehension Verbalized understanding          PT Short Term Goals - 12/18/16 0813      PT SHORT TERM GOAL #1   Title independent with initial HEP, postural correction including sleeping positions   12/29/16   Time 4   Period Weeks   Status Achieved     PT SHORT TERM GOAL #2   Title Cervical extension, rotation and sidebending ROM improved to at least 30 degrees needed for driving    Period Weeks   Status Achieved           PT Long Term Goals - 12/25/16 0957      PT  LONG TERM GOAL #1   Title Patient will be independent in safe self progression of HEP need for further improvements in postural strength   01/26/17   Time 8   Period Weeks   Status On-going     PT LONG TERM GOAL #2   Title Patient will report a 60% improvement in neck pain with ADLs including breastfeeding, driving, sleeping   Time 8   Period Weeks   Status On-going  40%               Plan - 12/28/16 1508    Clinical Impression Statement Patient had increaed tightness in her neck but pain has decreased by 55%.  Patient has increased pain when she turns her head.  Patient has increased tightness in left cervical musculature compared to right.  Patient will benefit from skilled therapy to reduce trigger points and stiffness in cervical so she is able to return to prior functional level.     Rehab Potential Good   PT Frequency 2x / week   PT Duration 8 weeks   PT Treatment/Interventions ADLs/Self Care Home Management;Cryotherapy;Electrical Stimulation;Moist Heat;Traction;Ultrasound;Functional mobility training;Therapeutic activities;Therapeutic exercise;Patient/family education;Manual techniques;Taping;Dry needling   PT Next Visit Plan  ROM and gradual muscular strength and endurance of cervical muscles and dry needling   PT Home Exercise Plan progress as needed   Consulted and Agree with Plan of Care Patient      Patient will benefit from skilled therapeutic intervention in order to improve the following deficits and impairments:  Decreased activity tolerance, Decreased range of motion, Decreased strength, Increased fascial restricitons, Increased muscle spasms, Impaired flexibility, Postural dysfunction, Improper body mechanics, Pain, Impaired UE functional use  Visit Diagnosis: Cervicalgia     Problem List Patient Active Problem List   Diagnosis Date Noted  . Postpartum care following cesarean delivery (11/4) 04/23/2015    Eulis Fosterheryl Kadesia Robel, PT 12/28/16 3:12 PM   [facility] [address], [address] [place], [place], [address] Phone: [phone]   Fax:  [phone]  Name: Mindy Stewart MRN: [id number] Date of Birth: 08/28/1975

## 2016-12-28 NOTE — Patient Instructions (Addendum)
[employer]  [employer] will call you with a [place] number with information on the unit payment.   [facility] [address], [address] [place], [place] [address] Phone # [phone] Fax [phone]

## 2017-01-02 ENCOUNTER — Ambulatory Visit: Payer: BLUE CROSS/BLUE SHIELD | Admitting: Physical Therapy

## 2017-01-02 DIAGNOSIS — M62830 Muscle spasm of back: Secondary | ICD-10-CM

## 2017-01-02 DIAGNOSIS — M6281 Muscle weakness (generalized): Secondary | ICD-10-CM | POA: Diagnosis not present

## 2017-01-02 DIAGNOSIS — M542 Cervicalgia: Secondary | ICD-10-CM

## 2017-01-02 DIAGNOSIS — M545 Low back pain, unspecified: Secondary | ICD-10-CM

## 2017-01-02 NOTE — Therapy (Signed)
[facility] [address] [place], [place], [address] Phone: [phone]   Fax:  [phone]  Physical Therapy Treatment  Patient Details  Name: Mindy Stewart MRN: [id number] Date of Birth: 1976-03-26 Referring Provider: Louis Meckel  Encounter Date: 01/02/2017      PT End of Session - 01/02/17 1450    Visit Number 21   Date for PT Re-Evaluation 01/26/17   PT Start Time 1445   PT Stop Time 1527   PT Time Calculation (min) 42 min   Activity Tolerance Patient tolerated treatment well   Behavior During Therapy [facility] for tasks assessed/performed      Past Medical History:  Diagnosis Date  . Asthma    rare inhaler use  . Complication of anesthesia    problems with epidural- 3 attempts  . Postpartum care following cesarean delivery (11/4) 04/23/2015    Past Surgical History:  Procedure Laterality Date  . CESAREAN SECTION     x 2  . CESAREAN SECTION WITH BILATERAL TUBAL LIGATION Bilateral 04/23/2015   Procedure: CESAREAN SECTION WITH BILATERAL TUBAL LIGATION;  Surgeon: Maxie Better, MD;  Location: [facility];  Service: Obstetrics;  Laterality: Bilateral;  EDDL: 05/13/15 Allergy: Coconut, Tree Nuts, Prednisone  . DIAGNOSTIC LAPAROSCOPY     2002    There were no vitals filed for this visit.      Subjective Assessment - 01/02/17 1447    Subjective I feel more sore today.  THis weekend was tough.  I feel like my pillow was irritating.     Pertinent History goes by "[name]", breastfeeding 63 month old so can't take a lot of meds   Limitations Reading;Sitting   Patient Stated Goals have less pain   Currently in Pain? Yes   Pain Score 3    Pain Location Neck   Pain Orientation Right;Left   Pain Descriptors / Indicators Sore   Pain Type Acute pain   Pain Radiating Towards Headache   Pain Onset More than a month ago   Pain Frequency Intermittent   Aggravating Factors  side bending and looking up   Pain  Relieving Factors dry needling   Effect of Pain on Daily Activities all head movements   Multiple Pain Sites No                         [facility] Adult PT Treatment/Exercise - 01/02/17 0001      Neuro Re-ed    Neuro Re-ed Details  posture education in standing and sitting; creating arch in standing to reduce anterior pelvic tilt; sitting on sits bones and drawing in for abdominal engaged, UE bilater ER with core contractions, rows and ext with abdominal contraction     Neck Exercises: Seated   Cervical Rotation --  Towel used for mobilization 10x Bil, VC for posture   Lateral Flexion --  with theracane for TP Bil 3x 20-30 sec     Manual Therapy   Manual Therapy Myofascial release;Soft tissue mobilization   Soft tissue mobilization thoracic paraspinals rt and left   Myofascial Release thoracic spine right side                  PT Short Term Goals - 12/18/16 0813      PT SHORT TERM GOAL #1   Title independent with initial HEP, postural correction including sleeping positions   12/29/16   Time 4   Period Weeks   Status Achieved  PT SHORT TERM GOAL #2   Title Cervical extension, rotation and sidebending ROM improved to at least 30 degrees needed for driving    Period Weeks   Status Achieved           PT Long Term Goals - 01/02/17 1452      PT LONG TERM GOAL #1   Title Patient will be independent in safe self progression of HEP need for further improvements in postural strength   01/26/17   Time 8   Period Weeks   Status On-going     PT LONG TERM GOAL #2   Title Patient will report a 60% improvement in neck pain with ADLs including breastfeeding, driving, sleeping   Time 8   Period Weeks   Status On-going  42%     PT LONG TERM GOAL #3   Title Cervical extension, rotation, and sidebending ROM improved to 35 degrees needed for driving   Time 8   Period Weeks   Status On-going     PT LONG TERM GOAL #4   Title Cervical, glenohumeral and  scapular strength improved to grossly 4/5 to 4+/5 needed for lifting child   Time 8   Period Weeks   Status On-going               Plan - 01/02/17 1540    Clinical Impression Statement Patient has difficulty engaging abdominals due to poor standing posture that seems to stem from poor arch control.  Pt states she has arches but usually wears sandals in the summer so she was advised to wear arches in shoes for improved posture with standing activities.  Pt demonstrates decreased thoracic kyphosis in sitting and has a lot of muscle spasms and tenderness along thoracic spine.  She had improvement with manual work to [employer] parapsinals.  Overalll, she has improved cervial ROM and will benefit from skilled PT to continue working on reduced muscle spasms and overall greater postural stability.   Rehab Potential Good   PT Treatment/Interventions ADLs/Self Care Home Management;Cryotherapy;Electrical Stimulation;Moist Heat;Traction;Ultrasound;Functional mobility training;Therapeutic activities;Therapeutic exercise;Patient/family education;Manual techniques;Taping;Dry needling   PT Next Visit Plan  ROM and gradual muscular strength and endurance of cervical muscles and dry needling, re-assess thoracic paraspinals, breathing  with abdominal engaged   PT Home Exercise Plan progress as needed   Consulted and Agree with Plan of Care Patient      Patient will benefit from skilled therapeutic intervention in order to improve the following deficits and impairments:  Decreased activity tolerance, Decreased range of motion, Decreased strength, Increased fascial restricitons, Increased muscle spasms, Impaired flexibility, Postural dysfunction, Improper body mechanics, Pain, Impaired UE functional use  Visit Diagnosis: Cervicalgia  Acute bilateral low back pain without sciatica  Muscle weakness (generalized)  Muscle spasm of back     Problem List Patient Active Problem List   Diagnosis Date  Noted  . Postpartum care following cesarean delivery (11/4) 04/23/2015    Vincente PoliJakki Crosser, PT 01/02/2017, 5:21 PM  [facility] [address] [place], [place], [address] Phone: [phone]   Fax:  [phone]  Name: Darrell Jewelamara Sabo-Thayer MRN: [id number] Date of Birth: 03/06/1976

## 2017-01-04 ENCOUNTER — Ambulatory Visit: Payer: BLUE CROSS/BLUE SHIELD | Admitting: Physical Therapy

## 2017-01-04 DIAGNOSIS — M542 Cervicalgia: Secondary | ICD-10-CM | POA: Diagnosis not present

## 2017-01-04 DIAGNOSIS — M62830 Muscle spasm of back: Secondary | ICD-10-CM

## 2017-01-04 DIAGNOSIS — M545 Low back pain, unspecified: Secondary | ICD-10-CM

## 2017-01-04 DIAGNOSIS — M6281 Muscle weakness (generalized): Secondary | ICD-10-CM | POA: Diagnosis not present

## 2017-01-04 NOTE — Therapy (Signed)
[facility] [address] [place], [place], [address] Phone: [phone]   Fax:  [phone]  Physical Therapy Treatment  Patient Details  Name: Mindy Stewart MRN: [id number] Date of Birth: [birth year] Referring Provider: Louis Meckel  Encounter Date: 01/04/2017      PT End of Session - 01/04/17 1607    Visit Number 22   Date for PT Re-Evaluation 01/26/17   PT Start Time 1402   PT Stop Time 1446   PT Time Calculation (min) 44 min   Activity Tolerance Patient tolerated treatment well   Behavior During Therapy [facility] for tasks assessed/performed      Past Medical History:  Diagnosis Date  . Asthma    rare inhaler use  . Complication of anesthesia    problems with epidural- 3 attempts  . Postpartum care following cesarean delivery (11/4) 04/23/2015    Past Surgical History:  Procedure Laterality Date  . CESAREAN SECTION     x 2  . CESAREAN SECTION WITH BILATERAL TUBAL LIGATION Bilateral 04/23/2015   Procedure: CESAREAN SECTION WITH BILATERAL TUBAL LIGATION;  Surgeon: Maxie Better, MD;  Location: [facility];  Service: Obstetrics;  Laterality: Bilateral;  EDDL: 05/13/15 Allergy: Coconut, Tree Nuts, Prednisone  . DIAGNOSTIC LAPAROSCOPY     2002    There were no vitals filed for this visit.      Subjective Assessment - 01/04/17 1404    Subjective Not a lot of pain today.  Patient reports she is moving and stretching more than she has been able to in a long time.   Pertinent History goes by "Mindy Stewart", breastfeeding 65 month old so can't take a lot of meds   Limitations Reading;Sitting   Patient Stated Goals have less pain   Currently in Pain? Yes   Pain Score 1    Pain Location Neck   Pain Orientation Left   Pain Descriptors / Indicators Sore   Pain Type Acute pain   Pain Onset More than a month ago   Aggravating Factors  moving head   Pain Relieving Factors stretches, dry needling, massage   Effect of  Pain on Daily Activities looking, turning head   Multiple Pain Sites No                         [facility] Adult PT Treatment/Exercise - 01/04/17 0001      Neck Exercises: Seated   Cervical Rotation --  Towel used for mobilization 10x Bil, VC for posture   Lateral Flexion --  with theracane for TP Bil 3x 20-30 sec   Other Seated Exercise upper trap stretch     Neck Exercises: Supine   Cervical Rotation --  10x slow rotation  bil     Manual Therapy   Manual Therapy Myofascial release;Soft tissue mobilization   Soft tissue mobilization thoracic paraspinals rt and left, right upper trap, suboccipitals, cervical paraspinals   Myofascial Release thoracic spine right side          Trigger Point Dry Needling - 01/04/17 1408    Muscles Treated Upper Body Suboccipitals muscle group;Oblique capitus;Upper trapezius  thoracic multifidi T4 right side   Upper Trapezius Response Twitch reponse elicited;Palpable increased muscle length   Oblique Capitus Response Twitch response elicited;Palpable increased muscle length   SubOccipitals Response Twitch response elicited;Palpable increased muscle length                PT Short Term Goals - 12/18/16 8413  PT SHORT TERM GOAL #1   Title independent with initial HEP, postural correction including sleeping positions   12/29/16   Time 4   Period Weeks   Status Achieved     PT SHORT TERM GOAL #2   Title Cervical extension, rotation and sidebending ROM improved to at least 30 degrees needed for driving    Period Weeks   Status Achieved           PT Long Term Goals - 01/02/17 1452      PT LONG TERM GOAL #1   Title Patient will be independent in safe self progression of HEP need for further improvements in postural strength   01/26/17   Time 8   Period Weeks   Status On-going     PT LONG TERM GOAL #2   Title Patient will report a 60% improvement in neck pain with ADLs including breastfeeding, driving, sleeping    Time 8   Period Weeks   Status On-going  42%     PT LONG TERM GOAL #3   Title Cervical extension, rotation, and sidebending ROM improved to 35 degrees needed for driving   Time 8   Period Weeks   Status On-going     PT LONG TERM GOAL #4   Title Cervical, glenohumeral and scapular strength improved to grossly 4/5 to 4+/5 needed for lifting child   Time 8   Period Weeks   Status On-going               Plan - 01/04/17 1604    Clinical Impression Statement Pt was able to increase ROM after manual and dry needling today.  Pt continues to need skilled PT for improved musle length, core and postural control with scapular stability.   PT Treatment/Interventions ADLs/Self Care Home Management;Cryotherapy;Electrical Stimulation;Moist Heat;Traction;Ultrasound;Functional mobility training;Therapeutic activities;Therapeutic exercise;Patient/family education;Manual techniques;Taping;Dry needling   PT Next Visit Plan  ROM and gradual muscular strength and endurance of cervical muscles and dry needling, re-assess thoracic paraspinals, breathing  with abdominal engaged   PT Home Exercise Plan progress as needed   Consulted and Agree with Plan of Care Patient      Patient will benefit from skilled therapeutic intervention in order to improve the following deficits and impairments:  Decreased activity tolerance, Decreased range of motion, Decreased strength, Increased fascial restricitons, Increased muscle spasms, Impaired flexibility, Postural dysfunction, Improper body mechanics, Pain, Impaired UE functional use  Visit Diagnosis: Cervicalgia  Acute bilateral low back pain without sciatica  Muscle weakness (generalized)  Muscle spasm of back     Problem List Patient Active Problem List   Diagnosis Date Noted  . Postpartum care following cesarean delivery (11/4) 04/23/2015    Vincente PoliJakki Crosser , PT 01/04/2017, 4:08 PM  [facility] [address] [place], [place], [address] Phone: [phone]   Fax:  [phone]  Name: Mindy Stewart MRN: [id number] Date of Birth: 08/17/1975

## 2017-01-11 ENCOUNTER — Ambulatory Visit: Payer: BLUE CROSS/BLUE SHIELD | Admitting: Physical Therapy

## 2017-01-11 DIAGNOSIS — M545 Low back pain: Secondary | ICD-10-CM | POA: Diagnosis not present

## 2017-01-11 DIAGNOSIS — M542 Cervicalgia: Secondary | ICD-10-CM

## 2017-01-11 DIAGNOSIS — M6281 Muscle weakness (generalized): Secondary | ICD-10-CM | POA: Diagnosis not present

## 2017-01-11 DIAGNOSIS — M62830 Muscle spasm of back: Secondary | ICD-10-CM | POA: Diagnosis not present

## 2017-01-11 NOTE — Therapy (Signed)
[facility] [address], [address] [place], [place], [address] Phone: [phone]   Fax:  [phone]  Physical Therapy Treatment  Patient Details  Name: Mindy Stewart MRN: [id number] Date of Birth: 09/29/1975 Referring Provider: Louis MeckelJ Mauney  Encounter Date: 01/11/2017      PT End of Session - 01/11/17 1722    Visit Number 23   Date for PT Re-Evaluation 01/26/17   Authorization Type BC[employer] 30 visit limit   PT Start Time 1540   PT Stop Time 1633   PT Time Calculation (min) 53 min   Activity Tolerance Patient tolerated treatment well      Past Medical History:  Diagnosis Date  . Asthma    rare inhaler use  . Complication of anesthesia    problems with epidural- 3 attempts  . Postpartum care following cesarean delivery (11/4) 04/23/2015    Past Surgical History:  Procedure Laterality Date  . CESAREAN SECTION     x 2  . CESAREAN SECTION WITH BILATERAL TUBAL LIGATION Bilateral 04/23/2015   Procedure: CESAREAN SECTION WITH BILATERAL TUBAL LIGATION;  Surgeon: Maxie BetterSheronette Cousins, MD;  Location: [facility];  Service: Obstetrics;  Laterality: Bilateral;  EDDL: 05/13/15 Allergy: Coconut, Tree Nuts, Prednisone  . DIAGNOSTIC LAPAROSCOPY     2002    There were no vitals filed for this visit.      Subjective Assessment - 01/11/17 1543    Subjective I think the needling is worth it.  It really helped last time going lower between my shoulder blades.  That made an impact.  After last session, I could look up and down better, and side to side better.     Currently in Pain? No/denies   Pain Score 0-No pain   Pain Location Neck   Pain Orientation Left   Aggravating Factors  move too fast,  push too far with rotation to either side            [facility] PT Assessment - 01/11/17 0001      AROM   Overall AROM Comments flex 25, ext 45, 25 right SB 25, left 15; bil rotation 38  UE WFLs with min tightness                      [facility] Adult PT Treatment/Exercise - 01/11/17 0001      Self-Care   Posture discussed aquatic exercise    Other Self-Care Comments  TENs info for home use  TENS 7000 on Amazon     Moist Heat Therapy   Number Minutes Moist Heat 15 Minutes   Moist Heat Location Cervical     Electrical Stimulation   Electrical Stimulation Location right neck and interscapular region   Electrical Stimulation Action IFC   Electrical Stimulation Parameters 15 min intensity to tolerance supine with wedge behind back and behind knees   Electrical Stimulation Goals Pain     Manual Therapy   Soft tissue mobilization right rhomboids, paraspinals. upper traps, suboccipitals          Trigger Point Dry Needling - 01/11/17 1721    Consent Given? Yes   Muscles Treated Upper Body Rhomboids;Subscapularis   Upper Trapezius Response Twitch reponse elicited;Palpable increased muscle length   Oblique Capitus Response Twitch response elicited;Palpable increased muscle length   SubOccipitals Response Twitch response elicited;Palpable increased muscle length   Rhomboids Response Twitch response elicited;Palpable increased muscle length   Subscapularis Response Palpable increased muscle length     Right side  only.         PT Education - 01/11/17 1722    Education provided Yes   Education Details TENs recommendation   Person(s) Educated Patient   Methods Explanation;Handout   Comprehension Verbalized understanding          PT Short Term Goals - 01/11/17 1729      PT SHORT TERM GOAL #1   Title independent with initial HEP, postural correction including sleeping positions   12/29/16   Status Achieved     PT SHORT TERM GOAL #2   Title Cervical extension, rotation and sidebending ROM improved to at least 30 degrees needed for driving    Status Achieved           PT Long Term Goals - 01/11/17 1730      PT LONG TERM GOAL #1   Title Patient will be independent in  safe self progression of HEP need for further improvements in postural strength   01/26/17   Time 8   Period Weeks   Status On-going     PT LONG TERM GOAL #2   Title Patient will report a 60% improvement in neck pain with ADLs including breastfeeding, driving, sleeping   Time 8   Period Weeks   Status On-going     PT LONG TERM GOAL #3   Title Cervical extension, rotation, and sidebending ROM improved to 35 degrees needed for driving   Time 8   Period Weeks   Status On-going     PT LONG TERM GOAL #4   Title Cervical, glenohumeral and scapular strength improved to grossly 4/5 to 4+/5 needed for lifting child   Time 8   Period Weeks   Status On-going               Plan - 01/11/17 1723    Clinical Impression Statement The patient reports an improvement in her symptoms especially following DN and manual therapy last session.  She has a significant improvement in cervical extension and bilateral rotation ROM but still limited with driving.  Full shoulder ROM with minimal reports of stiffness.  She reports a feeling of weakness when attempting to swim underwater at the pool.  Continues to have multiple tender points in right interscapular muscles and suboccipitals.  Improved soft tissue length and tender point size and number following treatment session.     PT Frequency 2x / week   PT Duration 8 weeks   PT Treatment/Interventions ADLs/Self Care Home Management;Cryotherapy;Electrical Stimulation;Moist Heat;Traction;Ultrasound;Functional mobility training;Therapeutic activities;Therapeutic exercise;Patient/family education;Manual techniques;Taping;Dry needling   PT Next Visit Plan  ROM and gradual muscular strength and endurance of cervical muscles; dry needling to interscapular muscles and neck as needed;   breathing  with abdominal engaged      Patient will benefit from skilled therapeutic intervention in order to improve the following deficits and impairments:  Decreased activity  tolerance, Decreased range of motion, Decreased strength, Increased fascial restricitons, Increased muscle spasms, Impaired flexibility, Postural dysfunction, Improper body mechanics, Pain, Impaired UE functional use  Visit Diagnosis: Cervicalgia  Muscle weakness (generalized)     Problem List Patient Active Problem List   Diagnosis Date Noted  . Postpartum care following cesarean delivery (11/4) 04/23/2015   Lavinia Sharps, PT 01/11/17 5:33 PM Phone: [phone] Fax: [phone]  Vivien Presto 01/11/2017, 5:32 PM  [facility] [address], [address] [place], [place], [address] Phone: [phone]   Fax:  [phone]  Name: Mindy Stewart MRN: [id number] Date  of Birth: 12/07/1975

## 2017-01-11 NOTE — Patient Instructions (Signed)
A TENS unit would be beneficial for home pain control.     TENS UNIT  This is helpful for muscle pain and spasm.   Search and Purchase a TENS 7000 2nd edition at www.tenspros.com or www.amazon.com  (It should be less than $30)     TENS unit instructions:   Do not shower or bathe with the unit on  Turn the unit off before removing electrodes or batteries  If the electrodes lose stickiness add a drop of water to the electrodes after they are disconnected from the unit and place on plastic sheet. If you continued to have difficulty, call the TENS unit company to purchase more electrodes.  Do not apply lotion on the skin area prior to use. Make sure the skin is clean and dry as this will help prolong the life of the electrodes.  After use, always check skin for unusual red areas, rash or other skin difficulties. If there are any skin problems, does not apply electrodes to the same area.  Never remove the electrodes from the unit by pulling the wires.  Do not use the TENS unit or electrodes other than as directed.  Do not change electrode placement without consulting your therapist or physician.  Keep 2 fingers with between each electrode.    Lavinia SharpsStacy Simpson PT [facility] [address], [address] [place], [place] [address] Phone # [phone] Fax [phone]

## 2017-01-18 ENCOUNTER — Encounter: Payer: Self-pay | Admitting: Physical Therapy

## 2017-01-18 ENCOUNTER — Ambulatory Visit: Payer: BLUE CROSS/BLUE SHIELD | Attending: Family Medicine | Admitting: Physical Therapy

## 2017-01-18 DIAGNOSIS — M6281 Muscle weakness (generalized): Secondary | ICD-10-CM | POA: Diagnosis not present

## 2017-01-18 DIAGNOSIS — M545 Low back pain, unspecified: Secondary | ICD-10-CM

## 2017-01-18 DIAGNOSIS — M62830 Muscle spasm of back: Secondary | ICD-10-CM | POA: Insufficient documentation

## 2017-01-18 DIAGNOSIS — M542 Cervicalgia: Secondary | ICD-10-CM

## 2017-01-18 NOTE — Therapy (Signed)
[facility] [address] [place], [place], [address] Phone: [phone]   Fax:  [phone]  Physical Therapy Treatment  Patient Details  Name: Mindy Stewart MRN: [id number] Date of Birth: [birth year] Referring Provider: Amaryllis Dyke  Encounter Date: 01/18/2017      PT End of Session - 01/18/17 1401    Visit Number 24   Date for PT Re-Evaluation 01/26/17   Authorization Type BCBS 30 visit limit   PT Start Time 1401  dry needle and heat (3 charges)   PT Stop Time 1459   PT Time Calculation (min) 58 min   Activity Tolerance Patient tolerated treatment well   Behavior During Therapy [facility] for tasks assessed/performed      Past Medical History:  Diagnosis Date  . Asthma    rare inhaler use  . Complication of anesthesia    problems with epidural- 3 attempts  . Postpartum care following cesarean delivery (11/4) 04/23/2015    Past Surgical History:  Procedure Laterality Date  . CESAREAN SECTION     x 2  . CESAREAN SECTION WITH BILATERAL TUBAL LIGATION Bilateral 04/23/2015   Procedure: CESAREAN SECTION WITH BILATERAL TUBAL LIGATION;  Surgeon: Servando Salina, MD;  Location: [place] [facility];  Service: Obstetrics;  Laterality: Bilateral;  EDDL: 05/13/15 Allergy: Coconut, Tree Nuts, Prednisone  . DIAGNOSTIC LAPAROSCOPY     2002    There were no vitals filed for this visit.      Subjective Assessment - 01/18/17 1535    Subjective Pt states she feels a lot better and felt like she could turn to look over her left shoulder a lot more easily when driving   Limitations Reading;Sitting   Patient Stated Goals have less pain   Currently in Pain? No/denies                         [facility] Adult PT Treatment/Exercise - 01/18/17 0001      Neck Exercises: Supine   Other Supine Exercise yellow band horizontal abduction, shoulder ER on foam roll - 20x   Other Supine Exercise pec stretch on foam roll     Shoulder  Exercises: Supine   Other Supine Exercises pec stretch on foam roller     Shoulder Exercises: ROM/Strengthening   UBE (Upper Arm Bike) L2 3x3 fwd/back     Moist Heat Therapy   Number Minutes Moist Heat 10 Minutes   Moist Heat Location Cervical     Manual Therapy   Soft tissue mobilization right rhomboids, paraspinals. upper traps, suboccipitals          Trigger Point Dry Needling - 01/18/17 1510    Consent Given? Yes   Muscles Treated Upper Body --  thoracic multifidi   Oblique Capitus Response Twitch response elicited;Palpable increased muscle length   SubOccipitals Response Twitch response elicited;Palpable increased muscle length   Rhomboids Response Twitch response elicited;Palpable increased muscle length                PT Short Term Goals - 01/11/17 1729      PT SHORT TERM GOAL #1   Title independent with initial HEP, postural correction including sleeping positions   12/29/16   Status Achieved     PT SHORT TERM GOAL #2   Title Cervical extension, rotation and sidebending ROM improved to at least 30 degrees needed for driving    Status Achieved           PT Long  Term Goals - 01/18/17 1402      PT LONG TERM GOAL #1   Title Patient will be independent in safe self progression of HEP need for further improvements in postural strength   01/26/17   Time 8   Period Weeks   Status On-going     PT LONG TERM GOAL #2   Title Patient will report a 60% improvement in neck pain with ADLs including breastfeeding, driving, sleeping   Baseline 55-60%   Time 8   Period Weeks   Status Partially Met     PT LONG TERM GOAL #3   Title Cervical extension, rotation, and sidebending ROM improved to 35 degrees needed for driving   Time 8   Period Weeks   Status On-going     PT LONG TERM GOAL #4   Title Cervical, glenohumeral and scapular strength improved to grossly 4/5 to 4+/5 needed for lifting child   Time 8   Period Weeks   Status On-going                Plan - 01/18/17 1536    Clinical Impression Statement Patient had a lot of trigger points from T6 to T1 on right side thoracic paraspinals.  She had improved soft tissue length after manual treatments.  Pt was able to do strenghtening on foam roll without increased pain today.  Pt still limited ROM and weakness.   PT Treatment/Interventions ADLs/Self Care Home Management;Cryotherapy;Electrical Stimulation;Moist Heat;Traction;Ultrasound;Functional mobility training;Therapeutic activities;Therapeutic exercise;Patient/family education;Manual techniques;Taping;Dry needling   PT Next Visit Plan  ROM and gradual muscular strength and endurance of cervical muscles; dry needling to interscapular muscles and neck as needed;   breathing  with abdominal engaged   Consulted and Agree with Plan of Care Patient      Patient will benefit from skilled therapeutic intervention in order to improve the following deficits and impairments:  Decreased activity tolerance, Decreased range of motion, Decreased strength, Increased fascial restricitons, Increased muscle spasms, Impaired flexibility, Postural dysfunction, Improper body mechanics, Pain, Impaired UE functional use  Visit Diagnosis: Cervicalgia  Muscle weakness (generalized)  Acute bilateral low back pain without sciatica  Muscle spasm of back     Problem List Patient Active Problem List   Diagnosis Date Noted  . Postpartum care following cesarean delivery (11/4) 04/23/2015    Zannie Cove, PT 01/18/2017, 5:09 PM  [facility] [address] [place], [place], [address] Phone: [phone]   Fax:  [phone]  Name: Mindy Stewart MRN: [id number] Date of Birth: 1975-07-25

## 2017-01-23 ENCOUNTER — Encounter: Payer: BLUE CROSS/BLUE SHIELD | Admitting: Physical Therapy

## 2017-01-24 ENCOUNTER — Ambulatory Visit: Payer: BLUE CROSS/BLUE SHIELD

## 2017-01-24 DIAGNOSIS — M6281 Muscle weakness (generalized): Secondary | ICD-10-CM

## 2017-01-24 DIAGNOSIS — M545 Low back pain, unspecified: Secondary | ICD-10-CM

## 2017-01-24 DIAGNOSIS — M62830 Muscle spasm of back: Secondary | ICD-10-CM | POA: Diagnosis not present

## 2017-01-24 DIAGNOSIS — M542 Cervicalgia: Secondary | ICD-10-CM | POA: Diagnosis not present

## 2017-01-24 NOTE — Therapy (Signed)
[facility] [address], [place], [place], [address] Phone: [phone]   Fax:  [phone]  Physical Therapy Treatment  Patient Details  Name: Mindy Stewart MRN: [id number] Date of Birth: 09/03/1975 Referring Provider: Amaryllis Dyke  Encounter Date: 01/24/2017      PT End of Session - 01/24/17 1907    Visit Number 25   Date for PT Re-Evaluation 01/26/17   Authorization Type BCBS 30 visit limit   Authorization - Visit Number 25   Authorization - Number of Visits 30   PT Start Time 0174   PT Stop Time 1654  dry needling   PT Time Calculation (min) 39 min   Activity Tolerance Patient tolerated treatment well   Behavior During Therapy [facility] for tasks assessed/performed      Past Medical History:  Diagnosis Date  . Asthma    rare inhaler use  . Complication of anesthesia    problems with epidural- 3 attempts  . Postpartum care following cesarean delivery (11/4) 04/23/2015    Past Surgical History:  Procedure Laterality Date  . CESAREAN SECTION     x 2  . CESAREAN SECTION WITH BILATERAL TUBAL LIGATION Bilateral 04/23/2015   Procedure: CESAREAN SECTION WITH BILATERAL TUBAL LIGATION;  Surgeon: Servando Salina, MD;  Location: [place] [facility];  Service: Obstetrics;  Laterality: Bilateral;  EDDL: 05/13/15 Allergy: Coconut, Tree Nuts, Prednisone  . DIAGNOSTIC LAPAROSCOPY     2002    There were no vitals filed for this visit.      Subjective Assessment - 01/24/17 1905    Subjective I feel tight today.  My Lt UE has been falling asleep lately, cant determine a pattern.  Dry needling is helping.   Pertinent History goes by "[name]", breastfeeding 8 month old so can't take a lot of meds   Currently in Pain? Yes   Pain Score 1    Pain Location Neck   Pain Orientation Right;Left   Pain Descriptors / Indicators Headache   Pain Type Chronic pain   Pain Onset More than a month ago   Pain Frequency Intermittent   Aggravating Factors  headache today- not sure of cause   Pain Relieving Factors stretches, dry needling, massage                         [facility] Adult PT Treatment/Exercise - 01/24/17 0001      Neck Exercises: Machines for Strengthening   UBE (Upper Arm Bike) L1 x 6 (3/3)      Manual Therapy   Manual Therapy Soft tissue mobilization;Myofascial release   Manual therapy comments suboccipital release, elongation and trigger point release to bil cervical paraspinals, upper traps and thoracic paraspinals          Trigger Point Dry Needling - 01/24/17 1904    Consent Given? Yes   Muscles Treated Upper Body Suboccipitals muscle group;Oblique capitus  thoracic multifidi   Oblique Capitus Response Twitch response elicited;Palpable increased muscle length   SubOccipitals Response Twitch response elicited;Palpable increased muscle length                PT Short Term Goals - 01/11/17 1729      PT SHORT TERM GOAL #1   Title independent with initial HEP, postural correction including sleeping positions   12/29/16   Status Achieved     PT SHORT TERM GOAL #2   Title Cervical extension, rotation and sidebending ROM improved to at least 30 degrees  needed for driving    Status Achieved           PT Long Term Goals - 01/18/17 1402      PT LONG TERM GOAL #1   Title Patient will be independent in safe self progression of HEP need for further improvements in postural strength   01/26/17   Time 8   Period Weeks   Status On-going     PT LONG TERM GOAL #2   Title Patient will report a 60% improvement in neck pain with ADLs including breastfeeding, driving, sleeping   Baseline 55-60%   Time 8   Period Weeks   Status Partially Met     PT LONG TERM GOAL #3   Title Cervical extension, rotation, and sidebending ROM improved to 35 degrees needed for driving   Time 8   Period Weeks   Status On-going     PT LONG TERM GOAL #4   Title Cervical, glenohumeral and  scapular strength improved to grossly 4/5 to 4+/5 needed for lifting child   Time 8   Period Weeks   Status On-going               Plan - 01/24/17 1908    Clinical Impression Statement Pt with trigger points in bilateral suboccipitals.  Pt didn't tolerate dry needling in the cervcial multifidi today so focused on her thoracic spine.  Pt has comprehensive HEP for strength and flexibility.  Pt will be assessed next session to determine need for continued PT.  Pt reports 60% overall improvement in pain with sleep, breastfeeding, and driving.     Rehab Potential Good   PT Frequency 2x / week   PT Duration 8 weeks   PT Treatment/Interventions ADLs/Self Care Home Management;Cryotherapy;Electrical Stimulation;Moist Heat;Traction;Ultrasound;Functional mobility training;Therapeutic activities;Therapeutic exercise;Patient/family education;Manual techniques;Taping;Dry needling   PT Next Visit Plan ERO.  FOTO, take cervical A/ROM and shoulder strength measures.  Pt has a visit limit with insurance-discuss this.   Consulted and Agree with Plan of Care Patient      Patient will benefit from skilled therapeutic intervention in order to improve the following deficits and impairments:  Decreased activity tolerance, Decreased range of motion, Decreased strength, Increased fascial restricitons, Increased muscle spasms, Impaired flexibility, Postural dysfunction, Improper body mechanics, Pain, Impaired UE functional use  Visit Diagnosis: Cervicalgia  Muscle weakness (generalized)  Acute bilateral low back pain without sciatica  Muscle spasm of back     Problem List Patient Active Problem List   Diagnosis Date Noted  . Postpartum care following cesarean delivery (11/4) 04/23/2015    Sigurd Sos, PT 01/24/17 7:13 PM  [facility] [address], [place], [place], [address] Phone: [phone]   Fax:  [phone]  Name: Mindy Stewart MRN: [id number] Date of Birth: 03-24-1976

## 2017-01-25 ENCOUNTER — Ambulatory Visit: Payer: BLUE CROSS/BLUE SHIELD | Admitting: Physical Therapy

## 2017-01-25 DIAGNOSIS — M6281 Muscle weakness (generalized): Secondary | ICD-10-CM | POA: Diagnosis not present

## 2017-01-25 DIAGNOSIS — M542 Cervicalgia: Secondary | ICD-10-CM | POA: Diagnosis not present

## 2017-01-25 DIAGNOSIS — M62830 Muscle spasm of back: Secondary | ICD-10-CM | POA: Diagnosis not present

## 2017-01-25 DIAGNOSIS — M545 Low back pain: Secondary | ICD-10-CM | POA: Diagnosis not present

## 2017-01-25 NOTE — Therapy (Signed)
[facility] [address], [place], [place], [address] Phone: [phone]   Fax:  [phone]  Physical Therapy Treatment/Recertification  Patient Details  Name: Mindy Stewart MRN: [id number] Date of Birth: 21-Mar-1976 Referring Provider: Amaryllis Dyke  Encounter Date: 01/25/2017      PT End of Session - 01/25/17 2204    Visit Number 26   Number of Visits 30   Date for PT Re-Evaluation 03/22/17   Authorization Type [employer] 30 visit limit   Authorization - Visit Number 26   Authorization - Number of Visits 30   PT Start Time 5027   PT Stop Time 1615   PT Time Calculation (min) 45 min   Activity Tolerance Patient tolerated treatment well      Past Medical History:  Diagnosis Date  . Asthma    rare inhaler use  . Complication of anesthesia    problems with epidural- 3 attempts  . Postpartum care following cesarean delivery (11/4) 04/23/2015    Past Surgical History:  Procedure Laterality Date  . CESAREAN SECTION     x 2  . CESAREAN SECTION WITH BILATERAL TUBAL LIGATION Bilateral 04/23/2015   Procedure: CESAREAN SECTION WITH BILATERAL TUBAL LIGATION;  Surgeon: Servando Salina, MD;  Location: [facility];  Service: Obstetrics;  Laterality: Bilateral;  EDDL: 05/13/15 Allergy: Coconut, Tree Nuts, Prednisone  . DIAGNOSTIC LAPAROSCOPY     2002    There were no vitals filed for this visit.      Subjective Assessment - 01/25/17 1532    Subjective I'm sore today from DN from yesterday.  Having new onset of left shoulder pain she thinks from driving and sleeping on left side.  Overall improvement at 50% better.     Currently in Pain? Yes   Pain Location Neck   Pain Orientation Right   Pain Type Chronic pain            [facility] PT Assessment - 01/25/17 0001      Observation/Other Assessments   Focus on Therapeutic Outcomes (FOTO)  49%     AROM   Overall AROM Comments flexion 30, extension 50, 20 degrees  right./left;  right rot 38, left 20 degrees  Shoulder active ROM WNLS bilaterally     Strength   Overall Strength Comments 4/5 with pain produced left shoulder                     [facility] Adult PT Treatment/Exercise - 01/25/17 0001      Neck Exercises: Standing   Other Standing Exercises review of current HEP     Neck Exercises: Supine   Other Supine Exercise discussed progressing yellow band with increased reps to 30 reps, then progressing to red band      Neck Exercises: Prone   Axial Exentsion 5 reps   Shoulder Extension 5 reps   Rows Limitations 2x 5 reps   Other Prone Exercise quadruped UE lift, LE lift, alternating 5x each                  PT Short Term Goals - 01/25/17 2210      PT SHORT TERM GOAL #1   Title independent with initial HEP, postural correction including sleeping positions   12/29/16   Status Achieved     PT SHORT TERM GOAL #2   Title Cervical extension, rotation and sidebending ROM improved to at least 30 degrees needed for driving    Status Achieved  PT Long Term Goals - 01/25/17 2211      PT LONG TERM GOAL #1   Title Patient will be independent in safe self progression of HEP need for further improvements in postural strength   01/26/17   Time 8   Period Weeks   Status On-going     PT LONG TERM GOAL #2   Title Patient will report a 60% improvement in neck pain with ADLs including breastfeeding, driving, sleeping   Time 8   Period Weeks   Status Partially Met     PT LONG TERM GOAL #3   Title Cervical extension, rotation, and sidebending ROM improved to 35 degrees needed for driving   Time 8   Period Weeks   Status On-going     PT LONG TERM GOAL #4   Title Cervical, glenohumeral and scapular strength improved to grossly 4/5 to 4+/5 needed for lifting child   Time 8   Period Weeks   Status On-going     PT LONG TERM GOAL #5   Title FOTO functional outcome score improved to 38% indicating improved function  with less pain   Time 8   Period Weeks   Status New               Plan - 01/25/17 2204    Clinical Impression Statement The patient reports her overall improvement is at 50%.   She has had variable pain in location and intensity including new complaints of left shoulder symptoms this week.  She reports a positive response to dry needling.  Her cervical ROM has improved but still limited in bilateral sidebending and left rotation.  Her shoulder ROM is WNLs and not painful.  She continues to have cervical and periscapular muscle weakness particularly with endurance muscle fibers.  She would benefit from 3-4 additional visits to focus on endurance strengthening.  Her FOTO outcome score has improved from 56% to 49%.     Rehab Potential Good   PT Frequency 1x / week   PT Duration 8 weeks   PT Treatment/Interventions ADLs/Self Care Home Management;Cryotherapy;Electrical Stimulation;Moist Heat;Traction;Ultrasound;Functional mobility training;Therapeutic activities;Therapeutic exercise;Patient/family education;Manual techniques;Taping;Dry needling   PT Next Visit Plan decrease frequency 4 more visits;   postural strengthening;  deep cervical flexor and extensor strengthening      Patient will benefit from skilled therapeutic intervention in order to improve the following deficits and impairments:  Decreased activity tolerance, Decreased range of motion, Decreased strength, Increased fascial restricitons, Increased muscle spasms, Impaired flexibility, Postural dysfunction, Improper body mechanics, Pain, Impaired UE functional use  Visit Diagnosis: Cervicalgia - Plan: PT plan of care cert/re-cert  Muscle weakness (generalized) - Plan: PT plan of care cert/re-cert     Problem List Patient Active Problem List   Diagnosis Date Noted  . Postpartum care following cesarean delivery (11/4) 04/23/2015   Ruben Im, PT 01/25/17 10:17 PM Phone: [phone] Fax: [phone]  [name] 01/25/2017, 10:16 PM  [facility] [address] [place], [place], [address] Phone: [phone]   Fax:  [phone]  Name: Mindy Stewart MRN: [id number] Date of Birth: 06/28/1975

## 2017-01-25 NOTE — Patient Instructions (Signed)
Isometric Hold (Quadruped)   On hands and knees, slowly inhale, and then exhale. Pull navel toward spine and Hold for __5_ seconds. Continue to breathe in and out during hold. Rest for __5_ seconds. Repeat __5_ times. Do _1__ times a day.   Copyright  VHI. All rights reserved.  Bracing With Arm Raise (Quadruped)   On hands and knees find neutral spine. Tighten pelvic floor and abdominals and hold. Alternately lift arm to shoulder level. Repeat _5__ times. Do 1___ times a day.   Copyright  VHI. All rights reserved.  Bracing With Leg Raise (Quadruped)   On hands and knees find neutral spine. Tighten pelvic floor and abdominals and hold. Alternating legs, straighten and lift to hip level. Repeat _5__ times. Do 1___ times a day.   Copyright  VHI. All rights reserved.  Bracing With Arm / Leg Raise (Quadruped)   On hands and knees find neutral spine. Tighten pelvic floor and abdominals and hold. Alternating, lift arm to shoulder level and opposite leg to hip level. Repeat __5_ times. Do _1__ times a day.   Copyright  VHI. All rights reserved.      Lavinia SharpsStacy Simpson PT [facility] [address], [address] [place], [place] [address] Phone # [phone] Fax [phone]

## 2017-01-27 DIAGNOSIS — R202 Paresthesia of skin: Secondary | ICD-10-CM | POA: Diagnosis not present

## 2017-01-27 DIAGNOSIS — M79602 Pain in left arm: Secondary | ICD-10-CM | POA: Diagnosis not present

## 2017-02-01 ENCOUNTER — Ambulatory Visit: Payer: BLUE CROSS/BLUE SHIELD | Admitting: Physical Therapy

## 2017-02-01 DIAGNOSIS — M545 Low back pain: Secondary | ICD-10-CM | POA: Diagnosis not present

## 2017-02-01 DIAGNOSIS — M6281 Muscle weakness (generalized): Secondary | ICD-10-CM | POA: Diagnosis not present

## 2017-02-01 DIAGNOSIS — M542 Cervicalgia: Secondary | ICD-10-CM

## 2017-02-01 DIAGNOSIS — M792 Neuralgia and neuritis, unspecified: Secondary | ICD-10-CM | POA: Diagnosis not present

## 2017-02-01 DIAGNOSIS — M62830 Muscle spasm of back: Secondary | ICD-10-CM | POA: Diagnosis not present

## 2017-02-01 DIAGNOSIS — G5602 Carpal tunnel syndrome, left upper limb: Secondary | ICD-10-CM | POA: Diagnosis not present

## 2017-02-01 NOTE — Therapy (Addendum)
[facility] [address], [place], [place], [address] Phone: [phone]   Fax:  [phone]  Physical Therapy Treatment/Discharge Summary  Patient Details  Name: Mindy Stewart MRN: [id number] Date of Birth: 11-23-1975 Referring Provider: Amaryllis Dyke  Encounter Date: 02/01/2017      PT End of Session - 02/01/17 2258    Visit Number 27   Number of Visits 30   Date for PT Re-Evaluation 03/22/17   Authorization Type [employer] 30 visit limit   Authorization - Visit Number 61   Authorization - Number of Visits 30   PT Start Time 5638   PT Stop Time 1530   PT Time Calculation (min) 45 min   Activity Tolerance Patient tolerated treatment well      Past Medical History:  Diagnosis Date  . Asthma    rare inhaler use  . Complication of anesthesia    problems with epidural- 3 attempts  . Postpartum care following cesarean delivery (11/4) 04/23/2015    Past Surgical History:  Procedure Laterality Date  . CESAREAN SECTION     x 2  . CESAREAN SECTION WITH BILATERAL TUBAL LIGATION Bilateral 04/23/2015   Procedure: CESAREAN SECTION WITH BILATERAL TUBAL LIGATION;  Surgeon: Servando Salina, MD;  Location: [facility];  Service: Obstetrics;  Laterality: Bilateral;  EDDL: 05/13/15 Allergy: Coconut, Tree Nuts, Prednisone  . DIAGNOSTIC LAPAROSCOPY     2002    There were no vitals filed for this visit.      Subjective Assessment - 02/01/17 1447    Subjective Had left UE numbness/tingling and shooting pain.  Went to urgent care and diagnosed with nerve inflammation,  Alleve, using sling and Gabapentin but by the end of the day it was better.  Went to see the doctor and she thought it was "tennis elbow and carpal tunnel".     Currently in Pain? Yes   Pain Score 5    Pain Orientation Left   Pain Descriptors / Indicators Pins and needles;Numbness   Pain Type Acute pain   Pain Radiating Towards left UE to ulnar side of hand                          [facility] Adult PT Treatment/Exercise - 02/01/17 0001      Therapeutic Activites    Therapeutic Activities ADL's   ADL's long discussion on current status, changes and recommendations for self care     Traction   Type of Traction Cervical   Min (lbs) 6   Max (lbs) 13   Hold Time 60   Rest Time 10   Time 15                  PT Short Term Goals - 02/01/17 2306      PT SHORT TERM GOAL #1   Title independent with initial HEP, postural correction including sleeping positions   12/29/16   Status Achieved     PT SHORT TERM GOAL #2   Title Cervical extension, rotation and sidebending ROM improved to at least 30 degrees needed for driving    Status Achieved           PT Long Term Goals - 02/01/17 2306      PT LONG TERM GOAL #1   Title Patient will be independent in safe self progression of HEP need for further improvements in postural strength   01/26/17   Time 8   Period Weeks  Status On-going     PT LONG TERM GOAL #2   Title Patient will report a 60% improvement in neck pain with ADLs including breastfeeding, driving, sleeping   Time 8   Period Weeks   Status Partially Met     PT LONG TERM GOAL #3   Title Cervical extension, rotation, and sidebending ROM improved to 35 degrees needed for driving   Time 8   Period Weeks   Status On-going     PT LONG TERM GOAL #4   Title Cervical, glenohumeral and scapular strength improved to grossly 4/5 to 4+/5 needed for lifting child   Time 8   Period Weeks   Status On-going     PT LONG TERM GOAL #5   Title FOTO functional outcome score improved to 38% indicating improved function with less pain   Time 8   Period Weeks   Status On-going               Plan - 02/01/17 2259    Clinical Impression Statement The patient reports a change in status with new onset left UE sensory and motor disturbances.  She expresses frustration about 2 differing diagnoses given to her by the  urgent care and her PCP.  We discussed her symptoms and recommendations for self care.  Initiated trial of cervical traction with reports of increased muscle discomfort in her neck but decreased left UE numbness and tingling.     Rehab Potential Good   PT Frequency 1x / week   PT Duration 8 weeks   PT Treatment/Interventions ADLs/Self Care Home Management;Cryotherapy;Electrical Stimulation;Moist Heat;Traction;Ultrasound;Functional mobility training;Therapeutic activities;Therapeutic exercise;Patient/family education;Manual techniques;Taping;Dry needling   PT Next Visit Plan decrease frequency 3 more visits;  assess response to cervical traction and continue as needed;   postural strengthening;  deep cervical flexor and extensor strengthening      Patient will benefit from skilled therapeutic intervention in order to improve the following deficits and impairments:  Decreased activity tolerance, Decreased range of motion, Decreased strength, Increased fascial restricitons, Increased muscle spasms, Impaired flexibility, Postural dysfunction, Improper body mechanics, Pain, Impaired UE functional use  Visit Diagnosis: Cervicalgia  Muscle weakness (generalized)    PHYSICAL THERAPY DISCHARGE SUMMARY  Visits from Start of Care: 27  Current functional level related to goals / functional outcomes: Called patient after > 1 month gap in treatment.  She reports she is battling with the insurance company for therapy coverage.  She has also been out of town when her father was moved to hospice care and he has since passed away.  She states that she was surprised that she was able to sleep on an air mattress for 2 weeks without an exacerbation of pain.   She plans to follow up with the doctor.  Will discharge her from PT at this time.     Remaining deficits: As above   Education / Equipment: Comprehensive HEP  Plan: Patient agrees to discharge.  Patient goals were partially met. Patient is being  discharged due to the patient's request.  ?????        Problem List Patient Active Problem List   Diagnosis Date Noted  . Postpartum care following cesarean delivery (11/4) 04/23/2015   Ruben Im, PT 02/01/17 11:09 PM Phone: [phone] Fax: [phone] [name] 02/01/2017, 11:09 PM  [facility] [address], [place], [place], [address] Phone: [phone]   Fax:  [phone]  Name: Isamar Nazir MRN: [id number] Date of Birth:  07/22/1975   

## 2017-02-01 NOTE — Therapy (Signed)
[facility] [address], [place], [place], [address] Phone: [phone]   Fax:  [phone]  Physical Therapy Treatment  Patient Details  Name: Mindy Stewart MRN: [id number] Date of Birth: [birth year] Referring Provider: Amaryllis Dyke  Encounter Date: 02/01/2017      PT End of Session - 02/01/17 2258    Visit Number 27   Number of Visits 30   Date for PT Re-Evaluation 03/22/17   Authorization Type [employer] 30 visit limit   Authorization - Visit Number 27   Authorization - Number of Visits 30   PT Start Time 4163   PT Stop Time 1530   PT Time Calculation (min) 45 min   Activity Tolerance Patient tolerated treatment well      Past Medical History:  Diagnosis Date  . Asthma    rare inhaler use  . Complication of anesthesia    problems with epidural- 3 attempts  . Postpartum care following cesarean delivery (11/4) 04/23/2015    Past Surgical History:  Procedure Laterality Date  . CESAREAN SECTION     x 2  . CESAREAN SECTION WITH BILATERAL TUBAL LIGATION Bilateral 04/23/2015   Procedure: CESAREAN SECTION WITH BILATERAL TUBAL LIGATION;  Surgeon: Servando Salina, MD;  Location: [facility];  Service: Obstetrics;  Laterality: Bilateral;  EDDL: 05/13/15 Allergy: Coconut, Tree Nuts, Prednisone  . DIAGNOSTIC LAPAROSCOPY     2002    There were no vitals filed for this visit.      Subjective Assessment - 02/01/17 1447    Subjective Had left UE numbness/tingling and shooting pain.  Went to urgent care and diagnosed with nerve inflammation,  Alleve, using sling and Gabapentin but by the end of the day it was better.  Went to see the doctor and she thought it was "tennis elbow and carpal tunnel".                           [facility] Adult PT Treatment/Exercise - 02/01/17 0001      Therapeutic Activites    Therapeutic Activities ADL's   ADL's long discussion on current status, changes and recommendations for  self care     Traction   Type of Traction Cervical   Min (lbs) 6   Max (lbs) 13   Hold Time 60   Rest Time 10   Time 15                  PT Short Term Goals - 02/01/17 2306      PT SHORT TERM GOAL #1   Title independent with initial HEP, postural correction including sleeping positions   12/29/16   Status Achieved     PT SHORT TERM GOAL #2   Title Cervical extension, rotation and sidebending ROM improved to at least 30 degrees needed for driving    Status Achieved           PT Long Term Goals - 02/01/17 2306      PT LONG TERM GOAL #1   Title Patient will be independent in safe self progression of HEP need for further improvements in postural strength   01/26/17   Time 8   Period Weeks   Status On-going     PT LONG TERM GOAL #2   Title Patient will report a 60% improvement in neck pain with ADLs including breastfeeding, driving, sleeping   Time 8   Period Weeks   Status Partially Met  PT LONG TERM GOAL #3   Title Cervical extension, rotation, and sidebending ROM improved to 35 degrees needed for driving   Time 8   Period Weeks   Status On-going     PT LONG TERM GOAL #4   Title Cervical, glenohumeral and scapular strength improved to grossly 4/5 to 4+/5 needed for lifting child   Time 8   Period Weeks   Status On-going     PT LONG TERM GOAL #5   Title FOTO functional outcome score improved to 38% indicating improved function with less pain   Time 8   Period Weeks   Status On-going               Plan - 02/01/17 2259    Clinical Impression Statement The patient reports a change in status with new onset left UE sensory and motor disturbances.  She expresses frustration about 2 differing diagnoses given to her by the urgent care and her PCP.  We discussed her symptoms and recommendations for self care.  Initiated trial of cervical traction with reports of increased muscle discomfort in her neck but decreased left UE numbness and tingling.      Rehab Potential Good   PT Frequency 1x / week   PT Duration 8 weeks   PT Treatment/Interventions ADLs/Self Care Home Management;Cryotherapy;Electrical Stimulation;Moist Heat;Traction;Ultrasound;Functional mobility training;Therapeutic activities;Therapeutic exercise;Patient/family education;Manual techniques;Taping;Dry needling   PT Next Visit Plan decrease frequency 3 more visits;  assess response to cervical traction and continue as needed;   postural strengthening;  deep cervical flexor and extensor strengthening      Patient will benefit from skilled therapeutic intervention in order to improve the following deficits and impairments:  Decreased activity tolerance, Decreased range of motion, Decreased strength, Increased fascial restricitons, Increased muscle spasms, Impaired flexibility, Postural dysfunction, Improper body mechanics, Pain, Impaired UE functional use  Visit Diagnosis: Cervicalgia  Muscle weakness (generalized)     Problem List Patient Active Problem List   Diagnosis Date Noted  . Postpartum care following cesarean delivery (11/4) 04/23/2015    [name] 02/01/2017, 11:08 PM  [facility] [address], [place], [place], [address] Phone: [phone]   Fax:  [phone]  Name: Mindy Stewart MRN: [id number] Date of Birth: [birth year]

## 2017-02-27 ENCOUNTER — Ambulatory Visit: Payer: BLUE CROSS/BLUE SHIELD | Admitting: Family Medicine

## 2017-02-27 NOTE — Progress Notes (Deleted)
[facility] [address] [place], [place] [address] Phone: [phone] Subjective:    I'm seeing this patient by the request  of:  Marcelo Baldy, PA-C   CC: Left arm and neck pain  [id number]  Mindy Stewart is a 41 y.o. female coming in with complaint of left arm and neck pain. Patient has been in rehabilitation for greater than 6 months.  Onset-  Location Duration-  Character- Aggravating factors- Reliving factors-  Therapies tried-  Severity-     Past Medical History:  Diagnosis Date  . Asthma    rare inhaler use  . Complication of anesthesia    problems with epidural- 3 attempts  . Postpartum care following cesarean delivery (11/4) 04/23/2015   Past Surgical History:  Procedure Laterality Date  . CESAREAN SECTION     x 2  . CESAREAN SECTION WITH BILATERAL TUBAL LIGATION Bilateral 04/23/2015   Procedure: CESAREAN SECTION WITH BILATERAL TUBAL LIGATION;  Surgeon: Maxie Better, MD;  Location: [facility];  Service: Obstetrics;  Laterality: Bilateral;  EDDL: 05/13/15 Allergy: Coconut, Tree Nuts, Prednisone  . DIAGNOSTIC LAPAROSCOPY     2002   Social History   Social History  . Marital status: Married    Spouse name: N/A  . Number of children: N/A  . Years of education: N/A   Social History Main Topics  . Smoking status: Never Smoker  . Smokeless tobacco: Never Used  . Alcohol use Not on file  . Drug use: Unknown  . Sexual activity: Not on file   Other Topics Concern  . Not on file   Social History Narrative  . No narrative on file   Allergies  Allergen Reactions  . Peanuts [Peanut Oil] Anaphylaxis    All tree nuts cause allergic reaction  . Prednisone Shortness Of Breath    Felt panicked  . Coconut Oil Itching   No family history on file.   Past medical history, social, surgical and family history all reviewed in electronic medical record.  No pertanent information unless stated regarding to the chief  complaint.   Review of Systems:Review of systems updated and as accurate as of 02/27/17  No headache, visual changes, nausea, vomiting, diarrhea, constipation, dizziness, abdominal pain, skin rash, fevers, chills, night sweats, weight loss, swollen lymph nodes, body aches, joint swelling, muscle aches, chest pain, shortness of breath, mood changes.   Objective  unknown if currently breastfeeding. Systems examined below as of 02/27/17   General: No apparent distress alert and oriented x3 mood and affect normal, dressed appropriately.  HEENT: Pupils equal, extraocular movements intact  Respiratory: Patient's speak in full sentences and does not appear short of breath  Cardiovascular: No lower extremity edema, non tender, no erythema  Skin: Warm dry intact with no signs of infection or rash on extremities or on axial skeleton.  Abdomen: Soft nontender  Neuro: Cranial nerves II through XII are intact, neurovascularly intact in all extremities with 2+ DTRs and 2+ pulses.  Lymph: No lymphadenopathy of posterior or anterior cervical chain or axillae bilaterally.  Gait normal with good balance and coordination.  MSK:  Non tender with full range of motion and good stability and symmetric strength and tone of  elbows, wrist, hip, knee and ankles bilaterally.  Neck: Inspection unremarkable. No palpable stepoffs. Negative Spurling's maneuver. Full neck range of motion Grip strength and sensation normal in bilateral hands Strength good C4 to T1 distribution No sensory change to C4 to T1 Negative Hoffman sign  bilaterally Reflexes normal  Shoulder: left Inspection reveals no abnormalities, atrophy or asymmetry. Palpation is normal with no tenderness over AC joint or bicipital groove. ROM is full in all planes passively. Rotator cuff strength normal throughout. signs of impingement with positive Neer and Hawkin's tests, but negative empty can sign. Speeds and Yergason's tests normal. No labral  pathology noted with negative Obrien's, negative clunk and good stability. Normal scapular function observed. No painful arc and no drop arm sign. No apprehension sign  MSK US performed of: left This study was ordered, performed, and interpreted by Terrilee FilesZach Smith D.O.  Shoulder:   Supraspinatus:  Appears normal on long and transverse views, Bursal bulge seen with shoulder abduction on impingement view. Infraspinatus:  Appears normal on long and transverse views. Significant increase in Doppler flow Subscapularis:  Appears normal on long and transverse views. Positive bursa Teres Minor:  Appears normal on long and transverse views. AC joint:  Capsule undistended, no geyser sign. Glenohumeral Joint:  Appears normal without effusion. Glenoid Labrum:  Intact without visualized tears. Biceps Tendon:  Appears normal on long and transverse views, no fraying of tendon, tendon located in intertubercular groove, no subluxation with shoulder internal or external rotation.  Impression: Subacromial bursitis  Procedure: Real-time Ultrasound Guided Injection of left glenohumeral joint Device: GE Logiq E  Ultrasound guided injection is preferred based studies that show increased duration, increased effect, greater accuracy, decreased procedural pain, increased response rate with ultrasound guided versus blind injection.  Verbal informed consent obtained.  Time-out conducted.  Noted no overlying erythema, induration, or other signs of local infection.  Skin prepped in a sterile fashion.  Local anesthesia: Topical Ethyl chloride.  With sterile technique and under real time ultrasound guidance:  Joint visualized.  23g 1  inch needle inserted posterior approach. Pictures taken for needle placement. Patient did have injection of 2 cc of 1% lidocaine, 2 cc of 0.5% Marcaine, and 1.0 cc of Kenalog 40 mg/dL. Completed without difficulty  Pain immediately resolved suggesting accurate placement of the medication.    Advised to call if fevers/chills, erythema, induration, drainage, or persistent bleeding.  Images permanently stored and available for review in the ultrasound unit.  Impression: Technically successful ultrasound guided injection.     Impression and Recommendations:     This case required medical decision making of moderate complexity.      Note: This dictation was prepared with Dragon dictation along with smaller phrase technology. Any transcriptional errors that result from this process are unintentional.

## 2017-03-30 DIAGNOSIS — S93692A Other sprain of left foot, initial encounter: Secondary | ICD-10-CM | POA: Diagnosis not present

## 2017-09-12 DIAGNOSIS — J0100 Acute maxillary sinusitis, unspecified: Secondary | ICD-10-CM | POA: Diagnosis not present

## 2017-09-12 DIAGNOSIS — R05 Cough: Secondary | ICD-10-CM | POA: Diagnosis not present
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
## 2019-05-01 DIAGNOSIS — M545 Low back pain: Secondary | ICD-10-CM | POA: Diagnosis not present

## 2019-05-06 ENCOUNTER — Other Ambulatory Visit: Payer: Self-pay | Admitting: *Deleted

## 2019-05-06 DIAGNOSIS — Z20822 Contact with and (suspected) exposure to covid-19: Secondary | ICD-10-CM

## 2019-05-06 NOTE — Addendum Note (Signed)
Addended by: Marvene Staff on: 05/06/2019 02:54 PM   Modules accepted: Orders
